# Patient Record
Sex: Male | Born: 1990 | Race: Black or African American | Hispanic: No | Marital: Single | Smoking: Current every day smoker
Health system: Southern US, Community
[De-identification: ages and names within clinical notes are randomized; demographics above are authoritative.]

## PROBLEM LIST (undated history)

## (undated) DIAGNOSIS — K529 Noninfective gastroenteritis and colitis, unspecified: Secondary | ICD-10-CM

## (undated) DIAGNOSIS — G8929 Other chronic pain: Secondary | ICD-10-CM

## (undated) DIAGNOSIS — R1115 Cyclical vomiting syndrome unrelated to migraine: Secondary | ICD-10-CM

## (undated) DIAGNOSIS — K219 Gastro-esophageal reflux disease without esophagitis: Secondary | ICD-10-CM

## (undated) DIAGNOSIS — R109 Unspecified abdominal pain: Secondary | ICD-10-CM

## (undated) HISTORY — PX: WISDOM TOOTH EXTRACTION: SHX21

## (undated) HISTORY — PX: COLONOSCOPY: SHX174

---
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
## 2012-03-05 ENCOUNTER — Emergency Department (HOSPITAL_COMMUNITY)
Admission: EM | Admit: 2012-03-05 | Discharge: 2012-03-05 | Disposition: A | Discharge status: Home / Self Care | Payer: 59 | Attending: Emergency Medicine | Admitting: Emergency Medicine

## 2012-03-05 ENCOUNTER — Encounter (HOSPITAL_COMMUNITY): Payer: Self-pay | Admitting: *Deleted

## 2012-03-05 DIAGNOSIS — F17200 Nicotine dependence, unspecified, uncomplicated: Secondary | ICD-10-CM | POA: Insufficient documentation

## 2012-03-05 DIAGNOSIS — Z202 Contact with and (suspected) exposure to infections with a predominantly sexual mode of transmission: Secondary | ICD-10-CM | POA: Insufficient documentation

## 2012-03-05 LAB — URINALYSIS, ROUTINE W REFLEX MICROSCOPIC
Bilirubin Urine: NEGATIVE
Nitrite: NEGATIVE
Specific Gravity, Urine: 1.025 (ref 1.005–1.030)
Urobilinogen, UA: 0.2 mg/dL (ref 0.0–1.0)
pH: 6.5 (ref 5.0–8.0)

## 2012-03-05 LAB — URINE MICROSCOPIC-ADD ON

## 2012-03-05 MED ORDER — METRONIDAZOLE 500 MG PO TABS
2000.0000 mg | ORAL_TABLET | Freq: Once | ORAL | Status: AC
  Administered 2012-03-05: 2000 mg via ORAL
  Filled 2012-03-05: qty 4

## 2012-03-05 NOTE — ED Provider Notes (Signed)
History     CSN: [id number]  Arrival date & time 03/05/12  1953   First MD Initiated Contact with Patient 03/05/12 1954      Chief Complaint  Patient presents with  . Exposure to STD    (Consider location/radiation/quality/duration/timing/severity/associated sxs/prior treatment) HPI Comments: Patient comes to ED requesting evaluation for possible exposure to an STD.  States that his recent sexual partner advised him that he may have trichomonas.  Patient denies any symptoms at this time.    Patient is a 21 y.o. male presenting with STD exposure. The history is provided by the patient.  Exposure to STD This is a new problem. The current episode started 1 to 4 weeks ago. Pertinent negatives include no abdominal pain, arthralgias, chills, fever, headaches, nausea, numbness, rash, sore throat, swollen glands, urinary symptoms, vomiting or weakness. Nothing aggravates the symptoms. He has tried nothing for the symptoms. The treatment provided no relief.    History reviewed. No pertinent past medical history.  History reviewed. No pertinent past surgical history.  History reviewed. No pertinent family history.  History  Substance Use Topics  . Smoking status: Current Everyday Smoker  . Smokeless tobacco: Not on file  . Alcohol Use: Yes      Review of Systems  Constitutional: Negative for fever, chills, activity change and appetite change.  HENT: Negative for sore throat and trouble swallowing.   Gastrointestinal: Negative for nausea, vomiting and abdominal pain.  Genitourinary: Negative for urgency, hematuria, decreased urine volume, discharge, penile swelling, difficulty urinating, genital sores and penile pain.  Musculoskeletal: Negative for back pain and arthralgias.  Skin: Negative for color change, rash and wound.  Neurological: Negative for dizziness, weakness, numbness and headaches.  All other systems reviewed and are negative.    Allergies  Pollen extract  Home  Medications  No current outpatient prescriptions on file.  BP 140/89  Pulse 78  Temp 98 F (36.7 C) (Oral)  Resp 18  Ht 6' (1.829 m)  Wt 125 lb (56.7 kg)  BMI 16.95 kg/m2  SpO2 100%  Physical Exam  Nursing note and vitals reviewed. Constitutional: He is oriented to person, place, and time. He appears well-developed and well-nourished. No distress.  HENT:  Head: Normocephalic and atraumatic.  Cardiovascular: Normal rate, regular rhythm, normal heart sounds and intact distal pulses.   No murmur heard. Pulmonary/Chest: Effort normal and breath sounds normal.  Abdominal: Soft. Bowel sounds are normal.  Genitourinary: Testes normal and penis normal. Circumcised. No penile erythema or penile tenderness. No discharge found.  Musculoskeletal: Normal range of motion. He exhibits no edema.  Neurological: He is alert and oriented to person, place, and time. He exhibits normal muscle tone. Coordination normal.  Skin: Skin is warm and dry.    ED Course  Procedures (including critical care time)  Results for orders placed during the hospital encounter of 03/05/12  URINALYSIS, ROUTINE W REFLEX MICROSCOPIC      Component Value Range   Color, Urine YELLOW  YELLOW   APPearance CLEAR  CLEAR   Specific Gravity, Urine 1.025  1.005 - 1.030   pH 6.5  5.0 - 8.0   Glucose, UA NEGATIVE  NEGATIVE mg/dL   Hgb urine dipstick NEGATIVE  NEGATIVE   Bilirubin Urine NEGATIVE  NEGATIVE   Ketones, ur NEGATIVE  NEGATIVE mg/dL   Protein, ur NEGATIVE  NEGATIVE mg/dL   Urobilinogen, UA 0.2  0.0 - 1.0 mg/dL   Nitrite NEGATIVE  NEGATIVE   Leukocytes, UA TRACE (*) NEGATIVE  URINE MICROSCOPIC-ADD ON      Component Value Range   Squamous Epithelial / LPF RARE  RARE   WBC, UA 3-6  <3 WBC/hpf   Bacteria, UA FEW (*) RARE   Urine-Other MUCOUS PRESENT           MDM     urine, gonorrhea and Chlamydia cultures are pending.    Patient is asymptomatic at this time , abdomen is soft and nontender. Vital  signs are stable. Possible exposure to Trichomonas, so I will treat with a one-time dose of metronidazole.  I have advised him that he will be notified if the cultures are positive.    Pt agrees to care plan and verbalized understanding.  The patient appears reasonably screened and/or stabilized for discharge and I doubt any other medical condition or other [facility] requiring further screening, evaluation, or treatment in the ED at this time prior to discharge.   Japleen Tornow L. [place], [place] 03/10/12 2159

## 2012-03-05 NOTE — ED Notes (Signed)
States that his girlfriend tested positive for trichomonas and he wants to be checked out.

## 2012-03-05 NOTE — ED Notes (Signed)
Pt says he has been exposed to std ?trichimonas.

## 2012-03-07 LAB — URINE CULTURE
Colony Count: NO GROWTH
Culture: NO GROWTH

## 2012-03-08 LAB — GC/CHLAMYDIA PROBE AMP, GENITAL
Chlamydia, DNA Probe: NEGATIVE
GC Probe Amp, Genital: NEGATIVE

## 2012-03-13 NOTE — ED Provider Notes (Signed)
Medical screening examination/treatment/procedure(s) were performed by non-physician practitioner and as supervising physician I was immediately available for consultation/collaboration.   Shelda Jakes, MD 03/13/12 1258

## 2014-03-31 ENCOUNTER — Emergency Department (HOSPITAL_COMMUNITY)
Admission: EM | Admit: 2014-03-31 | Discharge: 2014-03-31 | Disposition: A | Discharge status: Home / Self Care | Payer: 59 | Attending: Emergency Medicine | Admitting: Emergency Medicine

## 2014-03-31 ENCOUNTER — Encounter (HOSPITAL_COMMUNITY): Payer: Self-pay | Admitting: Emergency Medicine

## 2014-03-31 DIAGNOSIS — R361 Hematospermia: Secondary | ICD-10-CM | POA: Insufficient documentation

## 2014-03-31 DIAGNOSIS — F17200 Nicotine dependence, unspecified, uncomplicated: Secondary | ICD-10-CM | POA: Insufficient documentation

## 2014-03-31 DIAGNOSIS — R109 Unspecified abdominal pain: Secondary | ICD-10-CM | POA: Insufficient documentation

## 2014-03-31 DIAGNOSIS — R1084 Generalized abdominal pain: Secondary | ICD-10-CM | POA: Insufficient documentation

## 2014-03-31 DIAGNOSIS — G8929 Other chronic pain: Secondary | ICD-10-CM | POA: Insufficient documentation

## 2014-03-31 DIAGNOSIS — R748 Abnormal levels of other serum enzymes: Secondary | ICD-10-CM | POA: Insufficient documentation

## 2014-03-31 DIAGNOSIS — R112 Nausea with vomiting, unspecified: Secondary | ICD-10-CM | POA: Insufficient documentation

## 2014-03-31 DIAGNOSIS — R197 Diarrhea, unspecified: Secondary | ICD-10-CM | POA: Insufficient documentation

## 2014-03-31 LAB — CBC WITH DIFFERENTIAL/PLATELET
BASOS PCT: 0 % (ref 0–1)
Basophils Absolute: 0 10*3/uL (ref 0.0–0.1)
EOS ABS: 0.2 10*3/uL (ref 0.0–0.7)
Eosinophils Relative: 3 % (ref 0–5)
HEMATOCRIT: 40.8 % (ref 39.0–52.0)
HEMOGLOBIN: 14.4 g/dL (ref 13.0–17.0)
Lymphocytes Relative: 46 % (ref 12–46)
Lymphs Abs: 2.8 10*3/uL (ref 0.7–4.0)
MCH: 29 pg (ref 26.0–34.0)
MCHC: 35.3 g/dL (ref 30.0–36.0)
MCV: 82.1 fL (ref 78.0–100.0)
MONO ABS: 0.4 10*3/uL (ref 0.1–1.0)
MONOS PCT: 7 % (ref 3–12)
Neutro Abs: 2.6 10*3/uL (ref 1.7–7.7)
Neutrophils Relative %: 44 % (ref 43–77)
Platelets: 269 10*3/uL (ref 150–400)
RBC: 4.97 MIL/uL (ref 4.22–5.81)
RDW: 13.6 % (ref 11.5–15.5)
WBC: 6 10*3/uL (ref 4.0–10.5)

## 2014-03-31 LAB — COMPREHENSIVE METABOLIC PANEL
ALBUMIN: 4.2 g/dL (ref 3.5–5.2)
ALT: 9 U/L (ref 0–53)
ANION GAP: 11 (ref 5–15)
AST: 20 U/L (ref 0–37)
Alkaline Phosphatase: 76 U/L (ref 39–117)
BILIRUBIN TOTAL: 0.5 mg/dL (ref 0.3–1.2)
BUN: 11 mg/dL (ref 6–23)
CHLORIDE: 98 meq/L (ref 96–112)
CO2: 28 mEq/L (ref 19–32)
CREATININE: 1.09 mg/dL (ref 0.50–1.35)
Calcium: 9.3 mg/dL (ref 8.4–10.5)
GFR calc Af Amer: >90 mL/min (ref >90)
GFR calc non Af Amer: >90 mL/min (ref >90)
Glucose, Bld: 105 mg/dL — ABNORMAL HIGH (ref 70–99)
Potassium: 4.3 mEq/L (ref 3.7–5.3)
Sodium: 137 mEq/L (ref 137–147)
Total Protein: 7.5 g/dL (ref 6.0–8.3)

## 2014-03-31 LAB — URINALYSIS, ROUTINE W REFLEX MICROSCOPIC
BILIRUBIN URINE: NEGATIVE
Glucose, UA: NEGATIVE mg/dL
Hgb urine dipstick: NEGATIVE
KETONES UR: NEGATIVE mg/dL
Leukocytes, UA: NEGATIVE
NITRITE: NEGATIVE
PROTEIN: NEGATIVE mg/dL
UROBILINOGEN UA: 0.2 mg/dL (ref 0.0–1.0)
pH: 6 (ref 5.0–8.0)

## 2014-03-31 LAB — LIPASE, BLOOD: LIPASE: 82 U/L — AB (ref 11–59)

## 2014-03-31 MED ORDER — ONDANSETRON HCL 4 MG PO TABS: 4.0000 mg | ORAL_TABLET | Freq: Four times a day (QID) | ORAL | Status: DC | PRN

## 2014-03-31 NOTE — ED Provider Notes (Signed)
CSN: [id number]     Arrival date & time 03/31/14  0036 History   First MD Initiated Contact with Patient 03/31/14 0102     Chief Complaint  Patient presents with  . Abdominal Pain     (Consider location/radiation/quality/duration/timing/severity/associated sxs/prior Treatment) Patient is a 23 y.o. male presenting with abdominal pain. The history is provided by the patient.  Abdominal Pain He complains of crampy, generalized abdominal pain for the past year. Symptoms to be worse in the morning when he wakes up and then will subside after a few hours. Pain sometimes wakes him up in the early morning hours. He rates pain at 8/10 to when it is flaring up in the morning and then it subsides over the course of the day. He will frequently will have vomiting or diarrhea shortly after getting up the morning. He feels a little bit better if he vomits but has not noticed any change with diarrhea. He is not noticed any change in his appetite. Pain is not affected by eating. He denies fever, chills, sweats. He does not think he has lost any weight but his mother thinks he may have lost some weight. He has not done any treatment for this nor has he sought medical attention before tonight. A second complaint is that he noticed blood from his penis following sexual intercourse 2 days ago.  History reviewed. No pertinent past medical history. History reviewed. No pertinent past surgical history. History reviewed. No pertinent family history. History  Substance Use Topics  . Smoking status: Current Every Day Smoker  . Smokeless tobacco: Not on file  . Alcohol Use: Yes    Review of Systems  Gastrointestinal: Positive for abdominal pain.  All other systems reviewed and are negative.     Allergies  Pollen extract  Home Medications   Prior to Admission medications   Not on File   BP 117/81  Pulse 72  Temp(Src) 98.2 F (36.8 C) (Oral)  Resp 18  Ht 6' (1.829 m)  Wt 135 lb (61.236 kg)  BMI 18.31  kg/m2  SpO2 97% Physical Exam  Nursing note and vitals reviewed.  23 year old male, resting comfortably and in no acute distress. Vital signs are normal. Oxygen saturation is 97%, which is normal. Head is normocephalic and atraumatic. PERRLA, EOMI. Oropharynx is clear. Neck is nontender and supple without adenopathy or JVD. Back is nontender and there is no CVA tenderness. Lungs are clear without rales, wheezes, or rhonchi. Chest is nontender. Heart has regular rate and rhythm without murmur. Abdomen is soft, flat, nontender without masses or hepatosplenomegaly and peristalsis is normoactive. Today: Circumcised penis. Shotty bilateral inguinal adenopathy. Testes descended without masses. No other abnormality seen. Extremities have no cyanosis or edema, full range of motion is present. Skin is warm and dry without rash. Neurologic: Mental status is normal, cranial nerves are intact, there are no motor or sensory deficits.  ED Course  Procedures (including critical care time) Labs Review Results for orders placed during the hospital encounter of 03/31/14  CBC WITH DIFFERENTIAL      Result Value Ref Range   WBC 6.0  4.0 - 10.5 K/uL   RBC 4.97  4.22 - 5.81 MIL/uL   Hemoglobin 14.4  13.0 - 17.0 g/dL   HCT 40.9  81.1 - 91.4 %   MCV 82.1  78.0 - 100.0 fL   MCH 29.0  26.0 - 34.0 pg   MCHC 35.3  30.0 - 36.0 g/dL   RDW 13.6  11.5 - 15.5 %   Platelets 269  150 - 400 K/uL   Neutrophils Relative % 44  43 - 77 %   Neutro Abs 2.6  1.7 - 7.7 K/uL   Lymphocytes Relative 46  12 - 46 %   Lymphs Abs 2.8  0.7 - 4.0 K/uL   Monocytes Relative 7  3 - 12 %   Monocytes Absolute 0.4  0.1 - 1.0 K/uL   Eosinophils Relative 3  0 - 5 %   Eosinophils Absolute 0.2  0.0 - 0.7 K/uL   Basophils Relative 0  0 - 1 %   Basophils Absolute 0.0  0.0 - 0.1 K/uL  COMPREHENSIVE METABOLIC PANEL      Result Value Ref Range   Sodium 137  137 - 147 mEq/L   Potassium 4.3  3.7 - 5.3 mEq/L   Chloride 98  96 - 112 mEq/L    CO2 28  19 - 32 mEq/L   Glucose, Bld 105 (*) 70 - 99 mg/dL   BUN 11  6 - 23 mg/dL   Creatinine, Ser 1.611.09  0.50 - 1.35 mg/dL   Calcium 9.3  8.4 - 09.610.5 mg/dL   Total Protein 7.5  6.0 - 8.3 g/dL   Albumin 4.2  3.5 - 5.2 g/dL   AST 20  0 - 37 U/L   ALT 9  0 - 53 U/L   Alkaline Phosphatase 76  39 - 117 U/L   Total Bilirubin 0.5  0.3 - 1.2 mg/dL   GFR calc non Af Amer >90  >90 mL/min   GFR calc Af Amer >90  >90 mL/min   Anion gap 11  5 - 15  LIPASE, BLOOD      Result Value Ref Range   Lipase 82 (*) 11 - 59 U/L  URINALYSIS, ROUTINE W REFLEX MICROSCOPIC      Result Value Ref Range   Color, Urine YELLOW  YELLOW   APPearance CLEAR  CLEAR   Specific Gravity, Urine <1.005 (*) 1.005 - 1.030   pH 6.0  5.0 - 8.0   Glucose, UA NEGATIVE  NEGATIVE mg/dL   Hgb urine dipstick NEGATIVE  NEGATIVE   Bilirubin Urine NEGATIVE  NEGATIVE   Ketones, ur NEGATIVE  NEGATIVE mg/dL   Protein, ur NEGATIVE  NEGATIVE mg/dL   Urobilinogen, UA 0.2  0.0 - 1.0 mg/dL   Nitrite NEGATIVE  NEGATIVE   Leukocytes, UA NEGATIVE  NEGATIVE    MDM   Final diagnoses:  Abdominal pain of unknown etiology  Hemospermia  Nausea vomiting and diarrhea  Elevated lipase    Chronic abdominal pain of uncertain cause. This may be just represent irritable bowel syndrome, consider possibility of Crohn's disease. Workup is beyond the capabilities to the ED but screening labs will be obtained. Hemospermia of uncertain cause. He will be referred to urology for followup.  Initial workup is unremarkable. I suspect that his abdominal pain will eventually proved to be irritable bowel syndrome. He is referred to GI and urology for outpatient workup.  Dione Boozeavid Lizandra Zakrzewski, MD 03/31/14 0600

## 2014-03-31 NOTE — Discharge Instructions (Signed)
Your evaluation showed a mildly elevated lipase which I do not think is actually related to your pain. All of your other tests were normal. You should follow up with the gastroenterologist for further evaluation. You also should follow up with the urologist. Return if your symptoms are getting worse.  Ondansetron tablets What is this medicine? ONDANSETRON (on DAN se tron) is used to treat nausea and vomiting caused by chemotherapy. It is also used to prevent or treat nausea and vomiting after surgery. This medicine may be used for other purposes; ask your health care provider or pharmacist if you have questions. COMMON BRAND NAME(S): Zofran What should I tell my health care provider before I take this medicine? They need to know if you have any of these conditions: -heart disease -history of irregular heartbeat -liver disease -low levels of magnesium or potassium in the blood -an unusual or allergic reaction to ondansetron, granisetron, other medicines, foods, dyes, or preservatives -pregnant or trying to get pregnant -breast-feeding How should I use this medicine? Take this medicine by mouth with a glass of water. Follow the directions on your prescription label. Take your doses at regular intervals. Do not take your medicine more often than directed. Talk to your pediatrician regarding the use of this medicine in children. Special care may be needed. Overdosage: If you think you have taken too much of this medicine contact a poison control center or emergency room at once. NOTE: This medicine is only for you. Do not share this medicine with others. What if I miss a dose? If you miss a dose, take it as soon as you can. If it is almost time for your next dose, take only that dose. Do not take double or extra doses. What may interact with this medicine? Do not take this medicine with any of the following medications: -apomorphine -certain medicines for fungal infections like fluconazole,  itraconazole, ketoconazole, posaconazole, voriconazole -cisapride -dofetilide -dronedarone -pimozide -thioridazine -ziprasidone This medicine may also interact with the following medications: -carbamazepine -certain medicines for depression, anxiety, or psychotic disturbances -fentanyl -linezolid -MAOIs like Carbex, Eldepryl, Marplan, Nardil, and Parnate -methylene blue (injected into a vein) -other medicines that prolong the QT interval (cause an abnormal heart rhythm) -phenytoin -rifampicin -tramadol This list may not describe all possible interactions. Give your health care provider a list of all the medicines, herbs, non-prescription drugs, or dietary supplements you use. Also tell them if you smoke, drink alcohol, or use illegal drugs. Some items may interact with your medicine. What should I watch for while using this medicine? Check with your doctor or health care professional right away if you have any sign of an allergic reaction. What side effects may I notice from receiving this medicine? Side effects that you should report to your doctor or health care professional as soon as possible: -allergic reactions like skin rash, itching or hives, swelling of the face, lips or tongue -breathing problems -confusion -dizziness -fast or irregular heartbeat -feeling faint or lightheaded, falls -fever and chills -loss of balance or coordination -seizures -sweating -swelling of the hands or feet -tightness in the chest -tremors -unusually weak or tired Side effects that usually do not require medical attention (report to your doctor or health care professional if they continue or are bothersome): -constipation or diarrhea -headache This list may not describe all possible side effects. Call your doctor for medical advice about side effects. You may report side effects to FDA at 1-800-FDA-1088. Where should I keep my medicine? Keep out  of the reach of children. Store between 2  and 30 degrees C (36 and 86 degrees F). Throw away any unused medicine after the expiration date. NOTE: This sheet is a summary. It may not cover all possible information. If you have questions about this medicine, talk to your doctor, pharmacist, or health care provider.  2015, [employer]vier/Gold Standard. (2013-05-27 16:27:45)

## 2014-03-31 NOTE — ED Notes (Signed)
Patient reports diffuse abdominal pain. Reports has been having issues for approximately a year. Also reports nausea and vomiting. States blood came from tip of penis on Sunday after recently having intercourse.

## 2014-04-09 ENCOUNTER — Encounter (INDEPENDENT_AMBULATORY_CARE_PROVIDER_SITE_OTHER): Payer: Self-pay | Admitting: *Deleted

## 2014-04-09 ENCOUNTER — Encounter (INDEPENDENT_AMBULATORY_CARE_PROVIDER_SITE_OTHER): Payer: Self-pay | Admitting: Internal Medicine

## 2014-04-09 ENCOUNTER — Ambulatory Visit (INDEPENDENT_AMBULATORY_CARE_PROVIDER_SITE_OTHER): Payer: 59 | Admitting: Internal Medicine

## 2014-04-09 ENCOUNTER — Other Ambulatory Visit (INDEPENDENT_AMBULATORY_CARE_PROVIDER_SITE_OTHER): Payer: Self-pay | Admitting: *Deleted

## 2014-04-09 VITALS — BP 92/70 | HR 76 | Temp 97.9°F | Ht 72.0 in | Wt 124.9 lb

## 2014-04-09 DIAGNOSIS — G8929 Other chronic pain: Secondary | ICD-10-CM

## 2014-04-09 DIAGNOSIS — R112 Nausea with vomiting, unspecified: Secondary | ICD-10-CM | POA: Insufficient documentation

## 2014-04-09 DIAGNOSIS — R1013 Epigastric pain: Secondary | ICD-10-CM

## 2014-04-09 DIAGNOSIS — R1115 Cyclical vomiting syndrome unrelated to migraine: Secondary | ICD-10-CM

## 2014-04-09 MED ORDER — OMEPRAZOLE 40 MG PO CPDR: 40.0000 mg | DELAYED_RELEASE_CAPSULE | Freq: Every day | ORAL | Status: DC

## 2014-04-09 NOTE — Progress Notes (Signed)
Subjective:     Patient ID: Aaron Roman, male   DOB: 05/22/1991, 23 y.o.   MRN: [id number]  HPI Referred to our office from the ED for epigastric pain. Symptoms for a year. Nausea and vomiting associated with his symptoms.  N and V every morning.  Feels like food is sitting in his epigastric region. He says he has lost about 10 pounds over the past 3 months.  His BMs are green, or brown. No melena or BRRB.  He tells me he has the pain in the morning usually.  His appetite is not good. When he eats, he will have nausea. No particular foods.  No NSAID's.  While at office today, mother had to get him out of BR, He was vomiting. No fever associated with his symptoms.    CMP     Component Value Date/Time   NA 137 03/31/2014 0123   K 4.3 03/31/2014 0123   CL 98 03/31/2014 0123   CO2 28 03/31/2014 0123   GLUCOSE 105* 03/31/2014 0123   BUN 11 03/31/2014 0123   CREATININE 1.09 03/31/2014 0123   CALCIUM 9.3 03/31/2014 0123   PROT 7.5 03/31/2014 0123   ALBUMIN 4.2 03/31/2014 0123   AST 20 03/31/2014 0123   ALT 9 03/31/2014 0123   ALKPHOS 76 03/31/2014 0123   BILITOT 0.5 03/31/2014 0123   GFRNONAA >90 03/31/2014 0123   GFRAA >90 03/31/2014 0123      CBC    Component Value Date/Time   WBC 6.0 03/31/2014 0123   RBC 4.97 03/31/2014 0123   HGB 14.4 03/31/2014 0123   HCT 40.8 03/31/2014 0123   PLT 269 03/31/2014 0123   MCV 82.1 03/31/2014 0123   MCH 29.0 03/31/2014 0123   MCHC 35.3 03/31/2014 0123   RDW 13.6 03/31/2014 0123   LYMPHSABS 2.8 03/31/2014 0123   MONOABS 0.4 03/31/2014 0123   EOSABS 0.2 03/31/2014 0123   BASOSABS 0.0 03/31/2014 0123   Lipase     Component Value Date/Time   LIPASE 82* 03/31/2014 0123     Review of Systems History reviewed. No pertinent past medical history.  Past Surgical History  Procedure Laterality Date  . Wisdom tooth extraction      Allergies  Allergen Reactions  . Pollen Extract Itching    Current Outpatient Prescriptions on File Prior to Visit  Medication  Sig Dispense Refill  . ondansetron (ZOFRAN) 4 MG tablet Take 1 tablet (4 mg total) by mouth every 6 (six) hours as needed.  20 tablet  0   No current facility-administered medications on file prior to visit.        Objective:   Physical Exam  Filed Vitals:   04/09/14 0915  BP: 92/70  Pulse: 76  Temp: 97.9 F (36.6 C)  Height: 6' (1.829 m)  Weight: 124 lb 14.4 oz (56.654 kg)   Alert and oriented. Appears uncomfortable.  Skin warm and dry. Oral mucosa is moist.   . Sclera anicteric, conjunctivae is pink. Thyroid not enlarged. No cervical lymphadenopathy. Lungs clear. Heart regular rate and rhythm.  Abdomen is soft. Bowel sounds are positive. No hepatomegaly. No abdominal masses felt. No tenderness on exam.  No edema to lower extremities.       Assessment:     Epigastric pain with eating. Nausea and vomiting. PUD needs to be ruled out. GB disease in the differential     Plan:    Lipase. US abdomen. EGD. The risks and benefits such as perforation, bleeding, and  infection were reviewed with the patient and is agreeable.   Omeprazole 40 mg eprescribed to his pharmacy.

## 2014-04-09 NOTE — Patient Instructions (Signed)
EGD. US abdomen.  Lipase.

## 2014-04-10 LAB — LIPASE: Lipase: 27 U/L (ref 0–75)

## 2014-04-14 ENCOUNTER — Ambulatory Visit (HOSPITAL_COMMUNITY)
Admission: RE | Admit: 2014-04-14 | Discharge: 2014-04-14 | Disposition: A | Discharge status: Home / Self Care | Payer: 59 | Source: Ambulatory Visit | Attending: Internal Medicine | Admitting: Internal Medicine

## 2014-04-14 ENCOUNTER — Encounter (HOSPITAL_COMMUNITY): Payer: Self-pay | Admitting: *Deleted

## 2014-04-14 ENCOUNTER — Encounter (HOSPITAL_COMMUNITY)
Admission: RE | Disposition: A | Discharge status: Home / Self Care | Payer: Self-pay | Source: Ambulatory Visit | Attending: Internal Medicine

## 2014-04-14 ENCOUNTER — Other Ambulatory Visit (INDEPENDENT_AMBULATORY_CARE_PROVIDER_SITE_OTHER): Payer: Self-pay | Admitting: Internal Medicine

## 2014-04-14 DIAGNOSIS — R634 Abnormal weight loss: Secondary | ICD-10-CM | POA: Diagnosis not present

## 2014-04-14 DIAGNOSIS — F17200 Nicotine dependence, unspecified, uncomplicated: Secondary | ICD-10-CM | POA: Insufficient documentation

## 2014-04-14 DIAGNOSIS — R197 Diarrhea, unspecified: Secondary | ICD-10-CM | POA: Diagnosis not present

## 2014-04-14 DIAGNOSIS — R112 Nausea with vomiting, unspecified: Secondary | ICD-10-CM | POA: Insufficient documentation

## 2014-04-14 DIAGNOSIS — K219 Gastro-esophageal reflux disease without esophagitis: Secondary | ICD-10-CM | POA: Diagnosis not present

## 2014-04-14 DIAGNOSIS — K2960 Other gastritis without bleeding: Secondary | ICD-10-CM | POA: Diagnosis not present

## 2014-04-14 DIAGNOSIS — Z79899 Other long term (current) drug therapy: Secondary | ICD-10-CM | POA: Diagnosis not present

## 2014-04-14 DIAGNOSIS — R1013 Epigastric pain: Secondary | ICD-10-CM | POA: Insufficient documentation

## 2014-04-14 DIAGNOSIS — G8929 Other chronic pain: Secondary | ICD-10-CM

## 2014-04-14 HISTORY — PX: ESOPHAGOGASTRODUODENOSCOPY: SHX5428

## 2014-04-14 HISTORY — DX: Gastro-esophageal reflux disease without esophagitis: K21.9

## 2014-04-14 SURGERY — EGD (ESOPHAGOGASTRODUODENOSCOPY)
Anesthesia: Moderate Sedation

## 2014-04-14 MED ORDER — MIDAZOLAM HCL 5 MG/5ML IJ SOLN
INTRAMUSCULAR | Status: DC | PRN
Start: 2014-04-14 — End: 2014-04-14
  Administered 2014-04-14 (×2): 3 mg via INTRAVENOUS
  Administered 2014-04-14 (×2): 2 mg via INTRAVENOUS

## 2014-04-14 MED ORDER — MEPERIDINE HCL 50 MG/ML IJ SOLN
INTRAMUSCULAR | Status: AC
  Filled 2014-04-14: qty 1

## 2014-04-14 MED ORDER — SODIUM CHLORIDE 0.9 % IV SOLN
INTRAVENOUS | Status: DC
  Administered 2014-04-14: 08:00:00 via INTRAVENOUS

## 2014-04-14 MED ORDER — BUTAMBEN-TETRACAINE-BENZOCAINE 2-2-14 % EX AERO
INHALATION_SPRAY | CUTANEOUS | Status: DC | PRN
  Administered 2014-04-14: 2 via TOPICAL

## 2014-04-14 MED ORDER — MEPERIDINE HCL 50 MG/ML IJ SOLN
INTRAMUSCULAR | Status: DC | PRN
  Administered 2014-04-14 (×2): 25 mg via INTRAVENOUS

## 2014-04-14 MED ORDER — DICYCLOMINE HCL 10 MG PO CAPS: 10.0000 mg | ORAL_CAPSULE | Freq: Three times a day (TID) | ORAL | Status: DC

## 2014-04-14 MED ORDER — SIMETHICONE 40 MG/0.6ML PO SUSP
ORAL | Status: DC | PRN
Start: 2014-04-14 — End: 2014-04-14
  Administered 2014-04-14: 08:00:00

## 2014-04-14 MED ORDER — MIDAZOLAM HCL 5 MG/5ML IJ SOLN
INTRAMUSCULAR | Status: AC
  Filled 2014-04-14: qty 10

## 2014-04-14 NOTE — H&P (Signed)
Aaron Roman is an 23 y.o. male.   Chief Complaint: Patient is here for EGD. HPI: Patient is a [age] year old African male who presents with one-day history of intermittent epigastric pain postprandial nausea and vomiting. GU symptoms been occurring daily. Prior workup includes normal LFTs and serum calcium. He had mildly elevated lipase on one occasion but repeat was within normal limits. He has been on PPI for 6 days but not any better. He denies heartburn hematemesis melena or rectal bleeding. He also complains of diarrhea but 56 stools per day. He denies fever chills or night sweats. He smokes about half a pack of cigarettes per day. He drinks alcohol socially once or twice a week no more than 2 cans at a time. Family history is negative for inflammatory bowel disease or cholelithiasis. He works as a Production designer, theatre/television/filmmanager at [employer] [place]  Past Medical History  Diagnosis Date  . GERD (gastroesophageal reflux disease)     Past Surgical History  Procedure Laterality Date  . Wisdom tooth extraction      History reviewed. No pertinent family history. Social History:  reports that he has been smoking Cigarettes.  He has a 5 pack-year smoking history. He does not have any smokeless tobacco history on file. He reports that he drinks alcohol. He reports that he does not use illicit drugs.  Allergies:  Allergies  Allergen Reactions  . Codeine Nausea And Vomiting  . Pollen Extract Itching    Medications Prior to Admission  Medication Sig Dispense Refill  . omeprazole (PRILOSEC) 40 MG capsule Take 1 capsule (40 mg total) by mouth daily.  90 capsule  3  . ondansetron (ZOFRAN) 4 MG tablet Take 1 tablet (4 mg total) by mouth every 6 (six) hours as needed.  20 tablet  0    No results found for this or any previous visit (from the past 48 hour(s)). No results found.  ROS  Blood pressure 123/87, pulse 69, temperature 98 F (36.7 C), temperature source Oral, resp. rate 18, SpO2  100.00%. Physical Exam  Constitutional: He is oriented to person, place, and time.  Well-developed thin African American male in NAD  HENT:  Mouth/Throat: Oropharynx is clear and moist.  Eyes: Conjunctivae are normal. No scleral icterus.  Neck: No thyromegaly present.  Cardiovascular: Normal rate, regular rhythm and normal heart sounds.   No murmur heard. Respiratory: Effort normal and breath sounds normal.  GI: Soft. He exhibits no distension and no mass. There is no tenderness.  Musculoskeletal: He exhibits no edema.  Lymphadenopathy:    He has no cervical adenopathy.  Neurological: He is alert and oriented to person, place, and time.  Skin:  He has multiple tattoos located at both forearms and at abdomen     Assessment/Plan Epigastric pain, nausea and vomiting. Diagnostic EGD.  Benicia Bergevin U 04/14/2014, 8:34 AM

## 2014-04-14 NOTE — Discharge Instructions (Signed)
Resume usual medications and diet. Dicyclomine 10 mg by mouth 15-30 minutes daily before each meal. No driving for 24 hours. Ultrasound of upper abdomen as planned Small bowel follow-through to be scheduled.  Colonoscopy, Care After Refer to this sheet in the next few weeks. These instructions provide you with information on caring for yourself after your procedure. Your health care provider may also give you more specific instructions. Your treatment has been planned according to current medical practices, but problems sometimes occur. Call your health care provider if you have any problems or questions after your procedure. WHAT TO EXPECT AFTER THE PROCEDURE  After your procedure, it is typical to have the following:  A small amount of blood in your stool.  Moderate amounts of gas and mild abdominal cramping or bloating. HOME CARE INSTRUCTIONS  Do not drive, operate machinery, or sign important documents for 24 hours.  You may shower and resume your regular physical activities, but move at a slower pace for the first 24 hours.  Take frequent rest periods for the first 24 hours.  Walk around or put a warm pack on your abdomen to help reduce abdominal cramping and bloating.  Drink enough fluids to keep your urine clear or pale yellow.  You may resume your normal diet as instructed by your health care provider. Avoid heavy or fried foods that are hard to digest.  Avoid drinking alcohol for 24 hours or as instructed by your health care provider.  Only take over-the-counter or prescription medicines as directed by your health care provider.  If a tissue sample (biopsy) was taken during your procedure:  Do not take aspirin or blood thinners for 7 days, or as instructed by your health care provider.  Do not drink alcohol for 7 days, or as instructed by your health care provider.  Eat soft foods for the first 24 hours. SEEK MEDICAL CARE IF: You have persistent spotting of blood in  your stool 2-3 days after the procedure. SEEK IMMEDIATE MEDICAL CARE IF:  You have more than a small spotting of blood in your stool.  You pass large blood clots in your stool.  Your abdomen is swollen (distended).  You have nausea or vomiting.  You have a fever.  You have increasing abdominal pain that is not relieved with medicine.

## 2014-04-14 NOTE — Op Note (Signed)
EGD PROCEDURE REPORT  PATIENT:  Aaron Roman  MR#:  [id number] Birthdate:  01/22/1991, 23 y.o., male Endoscopist:  Dr. Malissa HippoNajeeb U. Rehman, MD  Procedure Date: 04/14/2014  Procedure:   EGD  Indications:  The patient is a 23 year old African male who presents with a one-year history of intermittent epigastric pain nausea and vomiting. Symptoms endocrine on daily basis lately. He has lost about 10 pounds. He also complains of nonbloody diarrhea. He was seen in emergency room about 2 weeks ago and serum lipase was mildly elevated. Rest of his blood work including CBC and comprehensive chemistry panel was normal. We repeated his office last week and it is normal. He is undergoing diagnostic EGD. He has been on PPI for few days and not any better.            Informed Consent:  The risks, benefits, alternatives & imponderables which include, but are not limited to, bleeding, infection, perforation, drug reaction and potential missed lesion have been reviewed.  The potential for biopsy, lesion removal, esophageal dilation, etc. have also been discussed.  Questions have been answered.  All parties agreeable.  Please see history & physical in medical record for more information.  Medications:  Demerol 50 mg IV Versed 10 mg IV Cetacaine spray topically for oropharyngeal anesthesia  Description of procedure:  The endoscope was introduced through the mouth and advanced to the second portion of the duodenum without difficulty or limitations. The mucosal surfaces were surveyed very carefully during advancement of the scope and upon withdrawal.  Findings:  Esophagus:  Mucosa of the esophagus was normal. GE junction was unremarkable. GEJ:  43 cm Stomach:  Stomach was empty and distended very well with insufflation. Folds in the proximal stomach were normal. Examination of mucosa at gastric body was normal. Two prepyloric erosions are noted. Bilateral external was patent. Angularis fundus and cardia are  normal. Duodenum:  There was a small piece of food debris in the bulb. Bulbar mucosa was normal. Post bulbar mucosa was also normal.  Therapeutic/Diagnostic Maneuvers Performed:  None  Complications:  None  Impression: Erosive gastritis otherwise normal EGD.  Comment; This finding would not explain patient's symptom complex.  Recommendations:  H. pylori serology. Continue omeprazole for now. Dicyclomine 10 mg by mouth 3 times a day before each meal. Ultrasound of upper abdomen and small bowel follow-through.  REHMAN,NAJEEB U  04/14/2014  8:57 AM  CC: Dr. Bonnetta BarryNo PCP Per Patient & Dr. No ref. provider found

## 2014-04-15 LAB — H. PYLORI ANTIBODY, IGG: H Pylori IgG: <0.4 {ISR}

## 2014-04-16 ENCOUNTER — Ambulatory Visit (HOSPITAL_COMMUNITY)
Admission: RE | Admit: 2014-04-16 | Discharge: 2014-04-16 | Disposition: A | Discharge status: Home / Self Care | Payer: 59 | Source: Ambulatory Visit | Attending: Internal Medicine | Admitting: Internal Medicine

## 2014-04-16 DIAGNOSIS — R1013 Epigastric pain: Secondary | ICD-10-CM | POA: Insufficient documentation

## 2014-04-16 DIAGNOSIS — G8929 Other chronic pain: Secondary | ICD-10-CM | POA: Insufficient documentation

## 2014-04-16 DIAGNOSIS — R1115 Cyclical vomiting syndrome unrelated to migraine: Secondary | ICD-10-CM | POA: Insufficient documentation

## 2014-04-16 DIAGNOSIS — R197 Diarrhea, unspecified: Secondary | ICD-10-CM | POA: Insufficient documentation

## 2014-04-16 DIAGNOSIS — R161 Splenomegaly, not elsewhere classified: Secondary | ICD-10-CM | POA: Insufficient documentation

## 2014-04-16 DIAGNOSIS — R112 Nausea with vomiting, unspecified: Secondary | ICD-10-CM

## 2014-04-16 DIAGNOSIS — R932 Abnormal findings on diagnostic imaging of liver and biliary tract: Secondary | ICD-10-CM | POA: Insufficient documentation

## 2014-04-16 DIAGNOSIS — R933 Abnormal findings on diagnostic imaging of other parts of digestive tract: Secondary | ICD-10-CM | POA: Insufficient documentation

## 2014-04-18 ENCOUNTER — Encounter (HOSPITAL_COMMUNITY): Payer: Self-pay | Admitting: Emergency Medicine

## 2014-04-18 ENCOUNTER — Emergency Department (HOSPITAL_COMMUNITY): Payer: 59

## 2014-04-18 ENCOUNTER — Emergency Department (HOSPITAL_COMMUNITY)
Admission: EM | Admit: 2014-04-18 | Discharge: 2014-04-18 | Disposition: A | Discharge status: Home / Self Care | Payer: 59 | Attending: Emergency Medicine | Admitting: Emergency Medicine

## 2014-04-18 DIAGNOSIS — G8929 Other chronic pain: Secondary | ICD-10-CM | POA: Diagnosis not present

## 2014-04-18 DIAGNOSIS — Z79899 Other long term (current) drug therapy: Secondary | ICD-10-CM | POA: Insufficient documentation

## 2014-04-18 DIAGNOSIS — F17200 Nicotine dependence, unspecified, uncomplicated: Secondary | ICD-10-CM | POA: Diagnosis not present

## 2014-04-18 DIAGNOSIS — R112 Nausea with vomiting, unspecified: Secondary | ICD-10-CM | POA: Diagnosis not present

## 2014-04-18 DIAGNOSIS — R1012 Left upper quadrant pain: Secondary | ICD-10-CM | POA: Diagnosis not present

## 2014-04-18 DIAGNOSIS — K219 Gastro-esophageal reflux disease without esophagitis: Secondary | ICD-10-CM | POA: Diagnosis not present

## 2014-04-18 HISTORY — DX: Unspecified abdominal pain: R10.9

## 2014-04-18 HISTORY — DX: Cyclical vomiting syndrome unrelated to migraine: R11.15

## 2014-04-18 HISTORY — DX: Noninfective gastroenteritis and colitis, unspecified: K52.9

## 2014-04-18 HISTORY — DX: Other chronic pain: G89.29

## 2014-04-18 LAB — COMPREHENSIVE METABOLIC PANEL
ALBUMIN: 4.3 g/dL (ref 3.5–5.2)
ALK PHOS: 71 U/L (ref 39–117)
ALT: 8 U/L (ref 0–53)
AST: 19 U/L (ref 0–37)
Anion gap: 11 (ref 5–15)
BUN: 10 mg/dL (ref 6–23)
CHLORIDE: 99 meq/L (ref 96–112)
CO2: 29 mEq/L (ref 19–32)
CREATININE: 0.9 mg/dL (ref 0.50–1.35)
Calcium: 9.4 mg/dL (ref 8.4–10.5)
GFR calc Af Amer: >90 mL/min (ref >90)
GFR calc non Af Amer: >90 mL/min (ref >90)
Glucose, Bld: 99 mg/dL (ref 70–99)
POTASSIUM: 4.1 meq/L (ref 3.7–5.3)
Sodium: 139 mEq/L (ref 137–147)
Total Bilirubin: 0.6 mg/dL (ref 0.3–1.2)
Total Protein: 7.4 g/dL (ref 6.0–8.3)

## 2014-04-18 LAB — CBC WITH DIFFERENTIAL/PLATELET
BASOS ABS: 0 10*3/uL (ref 0.0–0.1)
BASOS PCT: 0 % (ref 0–1)
Eosinophils Absolute: 0 10*3/uL (ref 0.0–0.7)
Eosinophils Relative: 1 % (ref 0–5)
HCT: 39.7 % (ref 39.0–52.0)
Hemoglobin: 13.7 g/dL (ref 13.0–17.0)
Lymphocytes Relative: 41 % (ref 12–46)
Lymphs Abs: 1.7 10*3/uL (ref 0.7–4.0)
MCH: 28.7 pg (ref 26.0–34.0)
MCHC: 34.5 g/dL (ref 30.0–36.0)
MCV: 83.1 fL (ref 78.0–100.0)
Monocytes Absolute: 0.3 10*3/uL (ref 0.1–1.0)
Monocytes Relative: 8 % (ref 3–12)
NEUTROS PCT: 50 % (ref 43–77)
Neutro Abs: 2.1 10*3/uL (ref 1.7–7.7)
PLATELETS: 270 10*3/uL (ref 150–400)
RBC: 4.78 MIL/uL (ref 4.22–5.81)
RDW: 13.4 % (ref 11.5–15.5)
WBC: 4.2 10*3/uL (ref 4.0–10.5)

## 2014-04-18 LAB — LIPASE, BLOOD: LIPASE: 65 U/L — AB (ref 11–59)

## 2014-04-18 MED ORDER — SODIUM CHLORIDE 0.9 % IV SOLN
INTRAVENOUS | Status: DC
  Administered 2014-04-18: 23:00:00 via INTRAVENOUS

## 2014-04-18 MED ORDER — PROMETHAZINE HCL 25 MG/ML IJ SOLN
12.5000 mg | Freq: Once | INTRAMUSCULAR | Status: AC
  Administered 2014-04-18: 12.5 mg via INTRAVENOUS
  Filled 2014-04-18: qty 1

## 2014-04-18 MED ORDER — FAMOTIDINE IN NACL 20-0.9 MG/50ML-% IV SOLN
20.0000 mg | Freq: Once | INTRAVENOUS | Status: AC
  Administered 2014-04-18: 20 mg via INTRAVENOUS
  Filled 2014-04-18: qty 50

## 2014-04-18 MED ORDER — PROMETHAZINE HCL 25 MG PO TABS: 25.0000 mg | ORAL_TABLET | Freq: Four times a day (QID) | ORAL | Status: DC | PRN

## 2014-04-18 MED ORDER — SODIUM CHLORIDE 0.9 % IV BOLUS (SEPSIS)
500.0000 mL | Freq: Once | INTRAVENOUS | Status: AC
  Administered 2014-04-18: 500 mL via INTRAVENOUS

## 2014-04-18 MED ORDER — DICYCLOMINE HCL 10 MG/ML IM SOLN
20.0000 mg | Freq: Once | INTRAMUSCULAR | Status: AC
  Administered 2014-04-18: 20 mg via INTRAMUSCULAR
  Filled 2014-04-18: qty 2

## 2014-04-18 MED ORDER — PROMETHAZINE HCL 25 MG RE SUPP: 25.0000 mg | Freq: Four times a day (QID) | RECTAL | Status: DC | PRN

## 2014-04-18 NOTE — ED Provider Notes (Signed)
CSN: [id number]     Arrival date & time 04/18/14  2054 History   First MD Initiated Contact with Patient 04/18/14 2110     Chief Complaint  Patient presents with  . Abdominal Pain     HPI Pt was seen at 2120. Per pt, c/o gradual onset and persistence of constant acute flair of his chronic of N/V/D for the past 1+ year. Pt describes his symptoms as occurring daily, "when I wake up," then subsiding over the course of the day. Has been associated with "cramping" and "sharp" LUQ abd pain. Denies any change in his symptoms over the past year and despite medications prescribed by his GI MD. Denies CP/SOB, no back pain, no fevers, no black or blood in stools or emesis.    GI: Rehman Past Medical History  Diagnosis Date  . GERD (gastroesophageal reflux disease)   . Chronic abdominal pain   . Cyclical vomiting with nausea   . Chronic diarrhea    Past Surgical History  Procedure Laterality Date  . Wisdom tooth extraction      History  Substance Use Topics  . Smoking status: Current Every Day Smoker -- 0.50 packs/day for 10 years    Types: Cigarettes  . Smokeless tobacco: Not on file  . Alcohol Use: Yes     Comment: 1-2 beers every other day    Review of Systems ROS: Statement: All systems negative except as marked or noted in the HPI; Constitutional: Negative for fever and chills. ; ; Eyes: Negative for eye pain, redness and discharge. ; ; ENMT: Negative for ear pain, hoarseness, nasal congestion, sinus pressure and sore throat. ; ; Cardiovascular: Negative for chest pain, palpitations, diaphoresis, dyspnea and peripheral edema. ; ; Respiratory: Negative for cough, wheezing and stridor. ; ; Gastrointestinal: +abd pain, N/V/D. Negative for blood in stool, hematemesis, jaundice and rectal bleeding. . ; ; Genitourinary: Negative for dysuria, flank pain and hematuria. ; ; Musculoskeletal: Negative for back pain and neck pain. Negative for swelling and trauma.; ; Skin: Negative for pruritus,  rash, abrasions, blisters, bruising and skin lesion.; ; Neuro: Negative for headache, lightheadedness and neck stiffness. Negative for weakness, altered level of consciousness , altered mental status, extremity weakness, paresthesias, involuntary movement, seizure and syncope.     Allergies  Codeine and Pollen extract  Home Medications   Prior to Admission medications   Medication Sig Start Date End Date Taking? Authorizing Provider  dicyclomine (BENTYL) 10 MG capsule Take 1 capsule (10 mg total) by mouth 3 (three) times daily before meals. 04/14/14   Malissa Hippo, MD  dicyclomine (BENTYL) 10 MG capsule Take 10 mg by mouth 4 (four) times daily -  before meals and at bedtime.    Historical Provider, MD  omeprazole (PRILOSEC) 40 MG capsule Take 1 capsule (40 mg total) by mouth daily. 04/09/14   Len Blalock, NP  ondansetron (ZOFRAN) 4 MG tablet Take 1 tablet (4 mg total) by mouth every 6 (six) hours as needed. 03/31/14   Dione Booze, MD   BP 106/72  Pulse 64  Temp(Src) 98.5 F (36.9 C) (Oral)  Resp 16  Ht 6' (1.829 m)  Wt 124 lb (56.246 kg)  BMI 16.81 kg/m2  SpO2 100% Physical Exam 2125: Physical examination:  Nursing notes reviewed; Vital signs and O2 SAT reviewed;  Constitutional: Well developed, Well nourished, Well hydrated, In no acute distress; Head:  Normocephalic, atraumatic; Eyes: EOMI, PERRL, No scleral icterus; ENMT: Mouth and pharynx normal, Mucous membranes moist;  Neck: Supple, Full range of motion, No lymphadenopathy; Cardiovascular: Regular rate and rhythm, No murmur, rub, or gallop; Respiratory: Breath sounds clear & equal bilaterally, No rales, rhonchi, wheezes.  Speaking full sentences with ease, Normal respiratory effort/excursion; Chest: Nontender, Movement normal; Abdomen: Soft, +mild LUQ tenderness to palp. No rebound or guarding. Nondistended, Normal bowel sounds; Genitourinary: No CVA tenderness; Extremities: Pulses normal, No tenderness, No edema, No calf edema or  asymmetry.; Neuro: AA&Ox3, Major CN grossly intact.  Speech clear. No gross focal motor or sensory deficits in extremities.; Skin: Color normal, Warm, Dry.   ED Course  Procedures    MDM  MDM Reviewed: previous chart, nursing note and vitals Reviewed previous: labs and x-ray Interpretation: labs and x-ray    Results for orders placed during the hospital encounter of 04/18/14  CBC WITH DIFFERENTIAL      Result Value Ref Range   WBC 4.2  4.0 - 10.5 K/uL   RBC 4.78  4.22 - 5.81 MIL/uL   Hemoglobin 13.7  13.0 - 17.0 g/dL   HCT 16.139.7  09.639.0 - 04.552.0 %   MCV 83.1  78.0 - 100.0 fL   MCH 28.7  26.0 - 34.0 pg   MCHC 34.5  30.0 - 36.0 g/dL   RDW 40.913.4  81.111.5 - 91.415.5 %   Platelets 270  150 - 400 K/uL   Neutrophils Relative % 50  43 - 77 %   Neutro Abs 2.1  1.7 - 7.7 K/uL   Lymphocytes Relative 41  12 - 46 %   Lymphs Abs 1.7  0.7 - 4.0 K/uL   Monocytes Relative 8  3 - 12 %   Monocytes Absolute 0.3  0.1 - 1.0 K/uL   Eosinophils Relative 1  0 - 5 %   Eosinophils Absolute 0.0  0.0 - 0.7 K/uL   Basophils Relative 0  0 - 1 %   Basophils Absolute 0.0  0.0 - 0.1 K/uL  COMPREHENSIVE METABOLIC PANEL      Result Value Ref Range   Sodium 139  137 - 147 mEq/L   Potassium 4.1  3.7 - 5.3 mEq/L   Chloride 99  96 - 112 mEq/L   CO2 29  19 - 32 mEq/L   Glucose, Bld 99  70 - 99 mg/dL   BUN 10  6 - 23 mg/dL   Creatinine, Ser 7.820.90  0.50 - 1.35 mg/dL   Calcium 9.4  8.4 - 95.610.5 mg/dL   Total Protein 7.4  6.0 - 8.3 g/dL   Albumin 4.3  3.5 - 5.2 g/dL   AST 19  0 - 37 U/L   ALT 8  0 - 53 U/L   Alkaline Phosphatase 71  39 - 117 U/L   Total Bilirubin 0.6  0.3 - 1.2 mg/dL   GFR calc non Af Amer >90  >90 mL/min   GFR calc Af Amer >90  >90 mL/min   Anion gap 11  5 - 15  LIPASE, BLOOD      Result Value Ref Range   Lipase 65 (*) 11 - 59 U/L   [place] Abdomen Complete 04/16/2014   ADDENDUM REPORT: 04/16/2014 11:17  ADDENDUM: The incorrect ultrasound worksheet was connected to this patient ultrasound examination.   INDICATION: Vomiting.  FINDINGS: Gallbladder: No gallstones, wall thickening or pericholecystic fluid. Negative sonographic Murphy sign.  Common bile duct: 3.0 mm  Liver: Normal echogenicity without focal lesion or biliary dilatation.  IVC:  Normal caliber.  Pancreas:  Normal.  Spleen:  Normal  size and echogenicity without focal lesions.  Right kidney: 10.8 cm. Normal renal cortical thickness and echogenicity without focal lesions or hydronephrosis.  Left kidney: 11.0 cm. Normal renal cortical thickness and echogenicity without focal lesions or hydronephrosis.  Abdominal aorta:  Normal calibre.  IMPRESSION: Normal abdominal ultrasound examination.   Electronically Signed   By: Loralie Champagne M.D.   On: 04/16/2014 11:17   Dg Abd Acute W/chest 04/18/2014   CLINICAL DATA:  Abdominal pain and diarrhea.  EXAM: ACUTE ABDOMEN SERIES (ABDOMEN 2 VIEW & CHEST 1 VIEW)  COMPARISON:  None.  FINDINGS: The upright chest x-ray is normal.  Two views of the abdomen demonstrate some residual barium in the colon related to a recent small bowel follow-through examination. No findings for small bowel obstruction or free air. The bony structures are intact.  IMPRESSION: No acute cardiopulmonary findings.  Residual contrast in the colon from recent small bowel follow-through examination. No findings for obstruction or perforation.   Electronically Signed   By: Loralie Champagne M.D.   On: 04/18/2014 22:36    2320:  Pt has tol PO well while in the ED without N/V.  No stooling while in the ED.  VSS. Feels better and wants to go home now. Dx and testing d/w pt and family.  Questions answered.  Verb understanding, agreeable to d/c home with outpt f/u.      Samuel Jester, DO 04/20/14 1810

## 2014-04-18 NOTE — ED Notes (Signed)
Pt took two sips of water and went back to sleep.

## 2014-04-18 NOTE — ED Notes (Signed)
Pt has chronic abdominal pain, currently having N & V, and diarrhea.

## 2014-04-18 NOTE — Discharge Instructions (Signed)
°Emergency Department Resource Guide °1) Find a Doctor and Pay Out of Pocket °Although you won't have to find out who is covered by your insurance plan, it is a good idea to ask around and get recommendations. You will then need to call the office and see if the doctor you have chosen will accept you as a new patient and what types of options they offer for patients who are self-pay. Some doctors offer discounts or will set up payment plans for their patients who do not have insurance, but you will need to ask so you aren't surprised when you get to your appointment. ° °2) Contact Your Local Health Department °Not all health departments have doctors that can see patients for sick visits, but many do, so it is worth a call to see if yours does. If you don't know where your local health department is, you can check in your phone book. The CDC also has a tool to help you locate your state's health department, and many state websites also have listings of all of their local health departments. ° °3) Find a Walk-in Clinic °If your illness is not likely to be very severe or complicated, you may want to try a walk in clinic. These are popping up all over the country in pharmacies, drugstores, and shopping centers. They're usually staffed by nurse practitioners or physician assistants that have been trained to treat common illnesses and complaints. They're usually fairly quick and inexpensive. However, if you have serious medical issues or chronic medical problems, these are probably not your best option. ° °No Primary Care Doctor: °- Call [facility] at  832-8000 - they can help you locate a primary care doctor that  accepts your insurance, provides certain services, etc. °- Physician Referral Service- 1-800-533-3463 ° °Chronic Pain Problems: °Organization         Address  Phone   Notes  °[facility]  (336) 297-2271 Patients need to be referred by their primary care doctor.  ° °Medication  Assistance: °Organization         Address  Phone   Notes  °[place] [facility] [address] °[place], [place] [address] (336) 641-8030 --Must be a resident of [place] °-- Must have NO insurance coverage whatsoever (no Medicaid/ Medicare, etc.) °-- The pt. MUST have a primary care doctor that directs their care regularly and follows them in the community °  °[facility]  (866) 331-1348   °[employer]  (888) 892-1162   ° °Agencies that provide inexpensive medical care: °Organization         Address  Phone   Notes  °[facility]  (336) 832-8035   °[facility]    (336) 832-7272   °[facility] [address] °[place], [place] [address] (336) 832-4777   °[facility] [address], °[place] (336) 271-4999   °[facility]    (336) 373-0678   °[facility]    (336) 272-1050   °[facility] ° [address], [place] Phone:  (336) 832-4444, Fax:  (336) 832-4440 Hours of Operation:  9 am - 6 pm, M-F.  Also accepts Medicaid/Medicare and self-pay.  °[facility] ° [address], [place] Phone: (336) 832-3150, Fax: (336) [phone]. Hours of Operation:  8:30 am - 5:30 pm, M-F.  Also accepts Medicaid and self-pay.  °[facility] [place] [address], [place] Phone: (336) 878-6027   °[facility] [address], [place], [place] (336)723-1848, Ext. 123 Mondays & Thursdays: 7-9 AM.  First 15 patients are seen on a first come, first serve basis. °  ° °Medicaid-accepting [place] Providers: ° °Organization         Address  Phone   Notes  °[facility] [address], [place] (336) 641-2100 Also accepts self-pay patients.  °[facility] [address], [place] ° (336) 856-9996   °[facility] [address], [place]  (336) 288-8857   °[facility] [address] [place] [address], [place] (336) 299-7000   °Veita Bland [address], [place]  ° (336) 373-1557 Only accepts [place] Access Medicaid patients after they have their name applied to their card.  ° °Self-Pay (no insurance) in [place]: ° °Organization         Address  Phone   Notes  °Sickle Cell Patients, [facility] [address], [place] (336) 832-1970   °[facility] [address], [place] (336) 832-4400   °[facility] ° [address], [place] (336) 992-4800   °[facility]/Dr. Osei-Bonsu ° [address] [place] [address], [place] or [address], [place] (336) 841-8500 Phone number for both [place] and [place] locations is the same.  °[facility] [address], [place] (336) 299-0000   °[facility] [address] [place] [address], [place] or [address] (336) 852-7530 °(336) 878-2260   °[facility] [address], [place] (336) 350-1642, phone; (336) 294-5005, fax Sees patients 1st and 3rd Saturday of every month.  Must not qualify for public or private insurance (i.e. Medicaid, Medicare, [place] Health Choice, Veterans' Benefits) • Household income should be no more than 200% of the poverty level •The clinic cannot treat you if you are pregnant or think you are pregnant • Sexually transmitted diseases are not treated at the clinic.  ° ° °Dental Care: °Organization         Address  Phone  Notes  °[place] [facility] [address], [place] (336) 641-6152 Accepts children up to age 21 who are enrolled in Medicaid or [place] Health Choice; pregnant women with a Medicaid card; and children who have applied for Medicaid or [place] Health Choice, but were declined, whose parents can pay a reduced fee at time of service.  °[place]  [facility] [place]  [address], [place] (336) 641-7733 Accepts children up to age 21 who are enrolled in Medicaid or [place] Health Choice; pregnant women with a Medicaid card; and children who have applied for Medicaid or [place] Health Choice, but were declined, whose parents can pay a reduced fee at time of service.  °[facility] ° [address], [place] (336) 641-4533 Patients are seen by appointment only. Walk-ins are not accepted. [facility] will see patients 18 years of age and older. °Monday - Tuesday (8am-5pm) °Most Wednesdays (8:30-5pm) °$30 per visit, cash only  °[facility] ° [address], [place] (336) 641-4533 Patients are seen by appointment only. Walk-ins are not accepted. [facility] will see patients 18 years of age and older. °One   Wednesday Evening (Monthly: Volunteer Based).  $30 per visit, cash only  °[facility]  (919) 537-3737 for adults; Children under age 4, call Graduate Pediatric Dentistry at ([phone]) [phone]. Children aged 4-14, please call (919) [phone] to request a pediatric application. ° Dental services are provided in all areas of dental care including fillings, crowns and bridges, complete and partial dentures, implants, gum treatment, root canals, and extractions. Preventive care is also provided. Treatment is provided to both adults and children. °Patients are selected via a lottery and there is often a waiting list. °  °[facility] [address], °[place] ° (336) 763-8833 [email or web address] °  °[facility] [address], [place], [place] (336)723-1848, Ext. 123 Second and Fourth Thursday of each month, opens at 6:30 AM; Clinic ends at 9 AM.  Patients are seen on a first-come first-served basis, and a limited number are seen during each clinic.  ° °[facility] ° [address], [place], [place] (336) 723-7904    Eligibility Requirements °You must have lived in [place], [place], or [place] counties for at least the last three months. °  You cannot be eligible for state or federal sponsored healthcare insurance, including Veterans Administration, Medicaid, or Medicare. °  You generally cannot be eligible for healthcare insurance through your employer.  °  How to apply: °Eligibility screenings are held every Tuesday and Wednesday afternoon from 1:00 pm until 4:00 pm. You do not need an appointment for the interview!  °[facility] [address], [place], [place] 336-631-2330   °[facility]  336-342-8273   °[facility]  336-703-3100   °[facility]  336-570-6415   ° °Behavioral Health Resources in the Community: °Intensive Outpatient Programs °Organization         Address  Phone  Notes  °[facility] [address], [place], [place] 336-878-6098   °[facility] [address], [place], [place] 336-832-9800   °[facility] [address], [place], [place] ° 336-882-2125   °[facility] [address],  °[place], [place] 1-800-853-5163 or 336-641-4981   °Substance Abuse Resources °Organization         Address  Phone  Notes  °[facility]  336-882-2125   °[facility]  336-784-9470   °[facility]  336-285-9073   °[facility]  336-845-3988   °Residential & Outpatient Substance Abuse Program  1-800-659-3381   °Psychological Services °Organization         Address  Phone  Notes  °[facility]  336- 832-9600   °[facility]  336- 378-7881   °[facility] [address], [place] 1-800-853-5163 or 336-641-4981   ° °Mobile Crisis Teams °Organization         Address  Phone  Notes  °[facility], Mobile Crisis Care Unit  1-877-626-1772   °Assertive °[facility] ° [address]  [place], [place] 336-834-9664   °Sharon DeEsch [address] °[place] 336-554-5454   ° °Self-Help/Support Groups °Organization         Address  Phone             Notes  °[facility] of  - variety of support groups  336- 373-1402 Call for more information  °Narcotics Anonymous (NA), [facility] [address], °[place]  2 meetings at this location  ° °  Residential Treatment Programs Organization         Address  Phone  Notes  [facility] [address],    [place]  [phone]-(340)[phone]   [facility]  [address], [place] [phone], [place], [place] [phone]   [facility] [address], [place] [phone]) [phone] Admissions: 8am-3pm M-F  [facility] [address]    [place], [place] [phone]   [facility] [address], [place], [place] [phone]   [facility] [address]  [place], [place] [phone]   [facility] Intensive Outpatient [address], [place], [place] [phone]   [facility] ([facility] [address]  [place], [place] [phone] or [phone]   [facility] ([facility]) [address] [place], [place] [phone] Accepts Medicaid  [facility] [address]  [place] [phone] Substance Abuse/Addiction Treatment   [facility]         Address  Phone  Notes  [facility]  (641)[phone](888) [phone]   Angie FavaJulie Brannon, PhD [address], [address] [place], [place]   ([phone](336) [phone] or ([phone](336) [phone]   [facility]   [address] [place], [place] [phone](336) [phone]   [facility] [address], [place], [place] [phone](336) [phone] Insurance/Medicaid/sponsorship through [facility] [address]                                    [place], [place] [phone]) [phone] Therapy/tele-psych/case    [facility] [address].   [place], [place] [phone]) [phone]    Dr. Lolly MustacheArfeen  [phone](336) [phone]   [facility]  [employer] [facility] 1) [address], [place] 2) [address], [place] 3)  [address], [place] [phone]) [phone](336) [phone]  [phone](336) [phone]   [facility] Hot[facility] 947-527-1175(336) (934) [phone] or ([phone](336) [phone] (After Hours)       Eat a bland diet, avoiding greasy, fatty, fried foods, as well as spicy and acidic foods or beverages.  Avoid eating within the hour or 2 before going to bed or laying down.  Also avoid teas, colas, coffee, chocolate, pepermint and spearment.  Take over the counter pepcid, one or two tablets by mouth twice a day, for the next 3 to 4 weeks. Take the prescriptions as directed.  Call your regular GI doctor tomorrow to schedule a follow up appointment this week.  Return to the Emergency Department immediately if worsening.

## 2014-04-20 ENCOUNTER — Encounter (INDEPENDENT_AMBULATORY_CARE_PROVIDER_SITE_OTHER): Payer: Self-pay | Admitting: Internal Medicine

## 2014-04-20 ENCOUNTER — Encounter (INDEPENDENT_AMBULATORY_CARE_PROVIDER_SITE_OTHER): Payer: Self-pay | Admitting: *Deleted

## 2014-04-20 ENCOUNTER — Ambulatory Visit (INDEPENDENT_AMBULATORY_CARE_PROVIDER_SITE_OTHER): Payer: 59 | Admitting: Internal Medicine

## 2014-04-20 VITALS — BP 98/58 | HR 72 | Temp 98.1°F | Ht 72.0 in | Wt 124.5 lb

## 2014-04-20 DIAGNOSIS — R1013 Epigastric pain: Secondary | ICD-10-CM

## 2014-04-20 DIAGNOSIS — G8929 Other chronic pain: Secondary | ICD-10-CM

## 2014-04-20 DIAGNOSIS — R1115 Cyclical vomiting syndrome unrelated to migraine: Secondary | ICD-10-CM

## 2014-04-20 NOTE — Progress Notes (Signed)
Subjective:     Patient ID: Aaron Roman, male   DOB: Dec 18, 1990, 23 y.o.   MRN: [id number]  HPI Here today for f/u. Recent hx of epigastric pain, nausea and vomiting. He also has had weight loss of 10 pounds over the past 3 months.  His pain, nausea and vomiting usually occur in the morning and gradually resolved.  Appetite is okay. He eats twice a day. He has not lost any weight since his last visit. Recently seen in the ED for Nausea and vomiting. Noted again his Lipase was slightly elevated.  He was given an Rx for Phenergan which has helped. The nausea occurs every morning. On the 16th of this month, he had to leave work because of the nausea which occurred in the evening.  He has not drank in 4-5 days. He usually drinks a beer or two not every day. He states he is heterosexual. Usually eats his 1st meal of the day around 1-2pm and last meal around 9 or 10 pm.     04/09/2014 US abdomen: abdominal pain, nausea and vomiting. IMPRESSION:  Normal abdominal ultrasound examination.  Electronically Signed  04/16/2014 Small bowel follow thru:  IMPRESSION:  Increased number of mucosal folds within the ileum versus normal.  While the number of folds within the jejunum does not appear  decreased, findings raise a question of jejunization.  This could potentially be seen with celiac disease, less likely  amyloidosis.  04/04/19/2015 EGD: N,V, D.  This finding would not explain patient's symptom complex.  Recommendations:  H. pylori serology.  Continue omeprazole for now.  Dicyclomine 10 mg by mouth 3 times a day before each meal.  Ultrasound of upper abdomen and small bowel follow-through.  CBC    Component Value Date/Time   WBC 4.2 04/18/2014 2153   RBC 4.78 04/18/2014 2153   HGB 13.7 04/18/2014 2153   HCT 39.7 04/18/2014 2153   PLT 270 04/18/2014 2153   MCV 83.1 04/18/2014 2153   MCH 28.7 04/18/2014 2153   MCHC 34.5 04/18/2014 2153   RDW 13.4 04/18/2014 2153   LYMPHSABS 1.7 04/18/2014 2153    MONOABS 0.3 04/18/2014 2153   EOSABS 0.0 04/18/2014 2153   BASOSABS 0.0 04/18/2014 2153   CMP     Component Value Date/Time   NA 139 04/18/2014 2153   K 4.1 04/18/2014 2153   CL 99 04/18/2014 2153   CO2 29 04/18/2014 2153   GLUCOSE 99 04/18/2014 2153   BUN 10 04/18/2014 2153   CREATININE 0.90 04/18/2014 2153   CALCIUM 9.4 04/18/2014 2153   PROT 7.4 04/18/2014 2153   ALBUMIN 4.3 04/18/2014 2153   AST 19 04/18/2014 2153   ALT 8 04/18/2014 2153   ALKPHOS 71 04/18/2014 2153   BILITOT 0.6 04/18/2014 2153   GFRNONAA >90 04/18/2014 2153   GFRAA >90 04/18/2014 2153   Lipase     Component Value Date/Time   LIPASE 65* 04/18/2014 2153       Review of Systems       Past Medical History  Diagnosis Date  . GERD (gastroesophageal reflux disease)   . Chronic abdominal pain   . Cyclical vomiting with nausea   . Chronic diarrhea     Past Surgical History  Procedure Laterality Date  . Wisdom tooth extraction      Allergies  Allergen Reactions  . Codeine Nausea And Vomiting  . Pollen Extract Itching    Current Outpatient Prescriptions on File Prior to Visit  Medication Sig Dispense  Refill  . dicyclomine (BENTYL) 10 MG capsule Take 1 capsule (10 mg total) by mouth 3 (three) times daily before meals.  90 capsule  2  . omeprazole (PRILOSEC) 40 MG capsule Take 1 capsule (40 mg total) by mouth daily.  90 capsule  3  . ondansetron (ZOFRAN) 4 MG tablet Take 1 tablet (4 mg total) by mouth every 6 (six) hours as needed.  20 tablet  0  . promethazine (PHENERGAN) 25 MG suppository Place 1 suppository (25 mg total) rectally every 6 (six) hours as needed for nausea or vomiting.  10 suppository  0  . promethazine (PHENERGAN) 25 MG tablet Take 1 tablet (25 mg total) by mouth every 6 (six) hours as needed for nausea or vomiting.  10 tablet  0   No current facility-administered medications on file prior to visit.            Objective:   Physical Exam  Filed Vitals:   04/20/14 1133  BP: 98/58   Pulse: 72  Temp: 98.1 F (36.7 C)  Height: 6' (1.829 m)  Weight: 124 lb 8 oz (56.473 kg)   Alert and oriented. Skin warm and dry. Oral mucosa is moist.   . Sclera anicteric, conjunctivae is pink. Thyroid not enlarged. No cervical lymphadenopathy. Lungs clear. Heart regular rate and rhythm.  Abdomen is soft. Bowel sounds are positive. No hepatomegaly. No abdominal masses felt. No tenderness.  No edema to lower extremities. Patient is alert and oriented.      Assessment:    Cyclic nausea and vomiting. No cause found yet. EGD, US and small bowel normal.    Plan:    Gastric emptying study and Celiac panel.

## 2014-04-20 NOTE — Patient Instructions (Signed)
Emptying study, Celiac panel.

## 2014-04-21 LAB — GLIADIN ANTIBODIES, SERUM
Gliadin IgA: 13.4 U/mL (ref <20)
Gliadin IgG: 21.9 U/mL — ABNORMAL HIGH (ref <20)

## 2014-04-21 LAB — TISSUE TRANSGLUTAMINASE, IGA: TISSUE TRANSGLUTAMINASE AB, IGA: 3.9 U/mL (ref <20)

## 2014-04-23 ENCOUNTER — Other Ambulatory Visit (HOSPITAL_COMMUNITY): Payer: Self-pay | Admitting: Pulmonary Disease

## 2014-04-23 ENCOUNTER — Ambulatory Visit (HOSPITAL_COMMUNITY)
Admission: RE | Admit: 2014-04-23 | Discharge: 2014-04-23 | Disposition: A | Discharge status: Home / Self Care | Payer: 59 | Source: Ambulatory Visit | Attending: Internal Medicine | Admitting: Internal Medicine

## 2014-04-23 ENCOUNTER — Encounter (HOSPITAL_COMMUNITY): Payer: Self-pay

## 2014-04-23 DIAGNOSIS — R1115 Cyclical vomiting syndrome unrelated to migraine: Secondary | ICD-10-CM | POA: Diagnosis not present

## 2014-04-23 DIAGNOSIS — R948 Abnormal results of function studies of other organs and systems: Secondary | ICD-10-CM | POA: Diagnosis not present

## 2014-04-23 LAB — RETICULIN ANTIBODIES, IGA W TITER: RETICULIN AB, IGA: NEGATIVE

## 2014-04-23 MED ORDER — TECHNETIUM TC 99M SULFUR COLLOID
2.0000 | Freq: Once | INTRAVENOUS | Status: AC | PRN
  Administered 2014-04-23: 2 via ORAL

## 2014-04-26 ENCOUNTER — Telehealth (INDEPENDENT_AMBULATORY_CARE_PROVIDER_SITE_OTHER): Payer: Self-pay | Admitting: *Deleted

## 2014-04-26 ENCOUNTER — Other Ambulatory Visit (INDEPENDENT_AMBULATORY_CARE_PROVIDER_SITE_OTHER): Payer: Self-pay | Admitting: Internal Medicine

## 2014-04-26 DIAGNOSIS — R1115 Cyclical vomiting syndrome unrelated to migraine: Secondary | ICD-10-CM

## 2014-04-26 MED ORDER — PROMETHAZINE HCL 25 MG PO TABS: ORAL_TABLET | ORAL | Status: DC

## 2014-04-26 NOTE — Telephone Encounter (Signed)
Results given to mother. Am still going to talk with Dr. Karilyn Cota

## 2014-04-26 NOTE — Telephone Encounter (Signed)
Rosey Bath, patient's mother, would like to get the test results. She can be reached at the ED [phone].

## 2014-04-26 NOTE — Telephone Encounter (Signed)
Rx eprescribed to his pharmacy

## 2014-04-28 ENCOUNTER — Other Ambulatory Visit (INDEPENDENT_AMBULATORY_CARE_PROVIDER_SITE_OTHER): Payer: Self-pay | Admitting: Internal Medicine

## 2014-04-28 DIAGNOSIS — R109 Unspecified abdominal pain: Secondary | ICD-10-CM

## 2014-04-28 DIAGNOSIS — R112 Nausea with vomiting, unspecified: Secondary | ICD-10-CM

## 2014-04-30 ENCOUNTER — Ambulatory Visit (HOSPITAL_COMMUNITY)
Admission: RE | Admit: 2014-04-30 | Discharge: 2014-04-30 | Disposition: A | Discharge status: Home / Self Care | Payer: 59 | Source: Ambulatory Visit | Attending: Internal Medicine | Admitting: Internal Medicine

## 2014-04-30 DIAGNOSIS — R109 Unspecified abdominal pain: Secondary | ICD-10-CM | POA: Diagnosis present

## 2014-04-30 DIAGNOSIS — R112 Nausea with vomiting, unspecified: Secondary | ICD-10-CM

## 2014-04-30 DIAGNOSIS — R634 Abnormal weight loss: Secondary | ICD-10-CM | POA: Diagnosis not present

## 2014-04-30 DIAGNOSIS — R1110 Vomiting, unspecified: Secondary | ICD-10-CM | POA: Insufficient documentation

## 2014-04-30 DIAGNOSIS — R197 Diarrhea, unspecified: Secondary | ICD-10-CM | POA: Insufficient documentation

## 2014-04-30 MED ORDER — IOHEXOL 300 MG/ML  SOLN
100.0000 mL | Freq: Once | INTRAMUSCULAR | Status: AC | PRN
  Administered 2014-04-30: 100 mL via INTRAVENOUS

## 2014-05-03 ENCOUNTER — Other Ambulatory Visit (INDEPENDENT_AMBULATORY_CARE_PROVIDER_SITE_OTHER): Payer: Self-pay | Admitting: Internal Medicine

## 2014-05-03 DIAGNOSIS — R634 Abnormal weight loss: Secondary | ICD-10-CM

## 2014-05-03 DIAGNOSIS — R197 Diarrhea, unspecified: Secondary | ICD-10-CM

## 2014-05-03 DIAGNOSIS — G8929 Other chronic pain: Secondary | ICD-10-CM

## 2014-05-03 DIAGNOSIS — R1013 Epigastric pain: Principal | ICD-10-CM

## 2014-05-03 DIAGNOSIS — R112 Nausea with vomiting, unspecified: Secondary | ICD-10-CM

## 2014-05-07 ENCOUNTER — Encounter (HOSPITAL_COMMUNITY): Payer: Self-pay

## 2014-05-07 ENCOUNTER — Ambulatory Visit (HOSPITAL_COMMUNITY)
Admission: RE | Admit: 2014-05-07 | Discharge: 2014-05-07 | Disposition: A | Discharge status: Home / Self Care | Payer: 59 | Source: Ambulatory Visit | Attending: Diagnostic Radiology | Admitting: Diagnostic Radiology

## 2014-05-07 DIAGNOSIS — R112 Nausea with vomiting, unspecified: Secondary | ICD-10-CM | POA: Diagnosis not present

## 2014-05-07 DIAGNOSIS — G8929 Other chronic pain: Secondary | ICD-10-CM

## 2014-05-07 DIAGNOSIS — R634 Abnormal weight loss: Secondary | ICD-10-CM | POA: Diagnosis not present

## 2014-05-07 DIAGNOSIS — R1013 Epigastric pain: Secondary | ICD-10-CM | POA: Diagnosis present

## 2014-05-07 DIAGNOSIS — R197 Diarrhea, unspecified: Secondary | ICD-10-CM

## 2014-05-07 MED ORDER — TECHNETIUM TC 99M MEBROFENIN IV KIT
5.0000 | PACK | Freq: Once | INTRAVENOUS | Status: AC | PRN
  Administered 2014-05-07: 5 via INTRAVENOUS

## 2014-05-07 MED ORDER — STERILE WATER FOR INJECTION IJ SOLN
INTRAMUSCULAR | Status: AC
  Administered 2014-05-07: 5 mL via INTRAVENOUS
  Filled 2014-05-07: qty 10

## 2014-05-07 MED ORDER — SINCALIDE 5 MCG IJ SOLR
INTRAMUSCULAR | Status: AC
  Administered 2014-05-07: 1.14 ug via INTRAVENOUS
  Filled 2014-05-07: qty 5

## 2014-05-11 ENCOUNTER — Telehealth (INDEPENDENT_AMBULATORY_CARE_PROVIDER_SITE_OTHER): Payer: Self-pay | Admitting: *Deleted

## 2014-05-11 NOTE — Telephone Encounter (Signed)
Rosey Bath would like to know what the next step is for Aaron Roman? Her return phone number is [phone] or [phone].

## 2014-05-12 NOTE — Telephone Encounter (Signed)
Patient needs office visit with me next

## 2014-05-12 NOTE — Telephone Encounter (Signed)
Dr.Rehman please advise. 

## 2014-05-12 NOTE — Telephone Encounter (Signed)
Forward to Dr.Rehman 

## 2014-05-14 NOTE — Telephone Encounter (Signed)
Apt has been scheduled for 05/17/14 with Dr. Karilyn Cota.

## 2014-05-17 ENCOUNTER — Encounter (INDEPENDENT_AMBULATORY_CARE_PROVIDER_SITE_OTHER): Payer: Self-pay | Admitting: *Deleted

## 2014-05-17 ENCOUNTER — Ambulatory Visit (INDEPENDENT_AMBULATORY_CARE_PROVIDER_SITE_OTHER): Payer: 59 | Admitting: Internal Medicine

## 2014-05-17 ENCOUNTER — Encounter (INDEPENDENT_AMBULATORY_CARE_PROVIDER_SITE_OTHER): Payer: Self-pay | Admitting: Internal Medicine

## 2014-05-17 VITALS — BP 104/68 | HR 70 | Temp 98.2°F | Resp 18 | Ht 72.0 in | Wt 132.8 lb

## 2014-05-17 DIAGNOSIS — R1013 Epigastric pain: Secondary | ICD-10-CM

## 2014-05-17 DIAGNOSIS — R1114 Bilious vomiting: Secondary | ICD-10-CM

## 2014-05-17 DIAGNOSIS — G8929 Other chronic pain: Secondary | ICD-10-CM

## 2014-05-17 MED ORDER — SUCRALFATE 1 G PO TABS: 2.0000 g | ORAL_TABLET | Freq: Every day | ORAL | Status: DC

## 2014-05-17 MED ORDER — DICYCLOMINE HCL 20 MG PO TABS: 20.0000 mg | ORAL_TABLET | Freq: Two times a day (BID) | ORAL | Status: DC

## 2014-05-17 NOTE — Patient Instructions (Signed)
Take omeprazole or Prilosec 30 minutes before evening meal daily. Keep symptom diary for two weeks and send [place] a summary.

## 2014-05-17 NOTE — Progress Notes (Signed)
Presenting complaint;  Followup for nausea vomiting and upper abdominal pain.  Subjective:  Patient is 23 year old African male who presents for reevaluation of nausea vomiting and upper abdominal pain. These symptoms started in September 2014. He has undergone multiple studies as reviewed and are lab data. He can tell any improvement with omeprazole her dicyclomine. Lately he experiences nausea and vomiting every morning. He wakes up with nausea around 7 AM and he vomits even before he eats breakfast. Vomitus is primarily bilious. He has not seen any food particles. Pain occurs in the epigastric region and left upper quadrant and seemed to get worse with meals. He usually does not eat breakfast. He eats lunch at 4 PM and then around 8 PM. He eats both of these meals at work; his meals include primarily fried chicken. His appetite is fair but he is afraid to eat. However he has gained 8 pounds since his last visit 4 weeks ago. He says his job is very stressful. He started his job 15 months ago. He works for 10-11 hours daily for 5-6 days each week. He is having 3-4 soft stools daily. He denies melena or rectal bleeding. He drinks no more than one beer at night before he goes to bed. He does not smoke cigarettes. 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   Current Medications: Outpatient Encounter Prescriptions as of 05/17/2014  Medication Sig  . dicyclomine (BENTYL) 10 MG capsule Take 1 capsule (10 mg total) by mouth 3 (three) times daily before meals.  Marland Kitchen omeprazole (PRILOSEC) 40 MG capsule Take 1 capsule (40 mg total) by mouth daily.  . [DISCONTINUED] ondansetron (ZOFRAN) 4 MG tablet Take 1 tablet (4 mg total) by mouth every 6 (six) hours as needed.  . [DISCONTINUED] promethazine (PHENERGAN) 25 MG suppository Place 1 suppository (25 mg total) rectally every 6 (six) hours as needed for nausea or vomiting.  . [DISCONTINUED] promethazine  (PHENERGAN) 25 MG tablet 12.5mg  every 6 hrs as needed for nausea     Objective: Blood pressure 104/68, pulse 70, temperature 98.2 F (36.8 C), temperature source Oral, resp. rate 18, height 6' (1.829 m), weight 132 lb 12.8 oz (60.238 kg). Patient is alert and in no acute distress. Conjunctiva is pink. Sclera is nonicteric Oropharyngeal mucosa is normal. No neck masses or thyromegaly noted. Cardiac exam with regular rhythm normal S1 and S2. No murmur or gallop noted. Lungs are clear to auscultation. Abdomen abdomen is flat. Bowel sounds are normal. On palpation is soft with mild tenderness in the epigastrium and left upper quadrant without organomegaly or masses.  No LE edema or clubbing noted.  Labs/studies Results: CBC and comprehensive chemistry panel normal on 03/31/2014(ER visit) but serum lipase was mildly elevated at 82. Serum lipase normal at 27 on 04/09/2014. H. pylori serology negative on 04/09/2014. CBC and comprehensive chemistry panel and normal on 04/18/2014( ER visit).  celiac antibody panel on 04/20/2014. Tissue transglutaminase antibody IgA negative. Gliadin antibody IgG mildly positive at 21.9 (normal less than 20). Gliadin antibody IgA 13.4   Ultrasound on 04/16/2014 negative for cholelithiasis. EGD on 04/14/2014 normal other than erosive antral gastritis but H. pylori serology negative as above. SBFT on 04/16/2014 revealing prominent mucosal folds within ileum. GES  With slight delay; 42% of activity in the stomach 2 hours. Abdominopelvic CT on 04/30/2014 unremarkable except descending colonic wall prominence probably due to incomplete distention.  HIDA scan on 05/07/2014 normal with EF  of 66%.   Assessment:  #1. Nausea vomiting and upper abdominal pain of one-year duration unresponsive to PPI and antispasmodic. He has undergone extensive evaluation and only abnormality was prominent ileal folds and borderline gliadin antibody IgG. Imaging studies negative for  SMA syndrome. Symptom complex may be secondary to stress and extended work schedule. If symptoms persist he will need colonoscopy with ileoscopy and possibly repeat EGD with duodenal biopsies. Reassuring to note 8 pound weight gain.  Plan:  Sucralfate 2 g by mouth each bedtime. Take Meprazole 40 mg by mouth daily before evening meal. Dicyclomine 20 mg by mouth twice a day(before rectus and lunch). Progress report in 2 weeks.

## 2014-07-27 ENCOUNTER — Telehealth (INDEPENDENT_AMBULATORY_CARE_PROVIDER_SITE_OTHER): Payer: Self-pay | Admitting: *Deleted

## 2014-07-27 ENCOUNTER — Other Ambulatory Visit (INDEPENDENT_AMBULATORY_CARE_PROVIDER_SITE_OTHER): Payer: Self-pay | Admitting: *Deleted

## 2014-07-27 MED ORDER — PROMETHAZINE HCL 25 MG PO TABS: 25.0000 mg | ORAL_TABLET | Freq: Two times a day (BID) | ORAL | Status: DC | PRN

## 2014-07-27 NOTE — Telephone Encounter (Signed)
A prescription has been sent to his pharmacy.

## 2014-07-27 NOTE — Telephone Encounter (Signed)
Per patient's Mother the patient is throwing up again, and request phenergan.

## 2014-07-27 NOTE — Telephone Encounter (Signed)
Donnie has started vomiting again and would like to get a Rx for Phenergan. Teresa's return phone number is [phone]. She is at work.

## 2014-08-17 ENCOUNTER — Ambulatory Visit (INDEPENDENT_AMBULATORY_CARE_PROVIDER_SITE_OTHER): Payer: 59 | Admitting: Internal Medicine

## 2014-08-18 ENCOUNTER — Telehealth (INDEPENDENT_AMBULATORY_CARE_PROVIDER_SITE_OTHER): Payer: Self-pay | Admitting: *Deleted

## 2014-08-18 NOTE — Telephone Encounter (Signed)
Missed appt and wants to reschedule

## 2014-08-24 NOTE — Telephone Encounter (Signed)
No answer or voice mail on either phone number listed.

## 2014-08-25 ENCOUNTER — Encounter (INDEPENDENT_AMBULATORY_CARE_PROVIDER_SITE_OTHER): Payer: Self-pay | Admitting: *Deleted

## 2014-08-25 ENCOUNTER — Telehealth (INDEPENDENT_AMBULATORY_CARE_PROVIDER_SITE_OTHER): Payer: Self-pay | Admitting: *Deleted

## 2014-08-25 NOTE — Telephone Encounter (Signed)
Aaron Roman NO SHOWED for is apt on 08/17/14 with Dr. Karilyn Cotaehman. A NS letter has been mailed.

## 2014-08-30 NOTE — Telephone Encounter (Signed)
Noted that the patient was a no show. 

## 2014-08-31 ENCOUNTER — Encounter (INDEPENDENT_AMBULATORY_CARE_PROVIDER_SITE_OTHER): Payer: Self-pay | Admitting: Internal Medicine

## 2014-08-31 ENCOUNTER — Ambulatory Visit (INDEPENDENT_AMBULATORY_CARE_PROVIDER_SITE_OTHER): Payer: 59 | Admitting: Internal Medicine

## 2014-08-31 VITALS — BP 98/40 | HR 72 | Temp 97.7°F | Ht 72.0 in | Wt 131.9 lb

## 2014-08-31 DIAGNOSIS — R110 Nausea: Secondary | ICD-10-CM

## 2014-08-31 MED ORDER — PROMETHAZINE HCL 12.5 MG PO TABS: 12.5000 mg | ORAL_TABLET | Freq: Four times a day (QID) | ORAL | Status: DC | PRN

## 2014-08-31 NOTE — Patient Instructions (Addendum)
Rx for PHenergan 12.5mg . OV in 4 months. Small frequent meals.  Try cutting back on fried foods.

## 2014-08-31 NOTE — Telephone Encounter (Signed)
Spoke with Aaron Roman's mother last week. Has an apt scheduled for today, 08/31/14, with Dorene Arerri Setzer, NP.

## 2014-08-31 NOTE — Progress Notes (Signed)
   Subjective:    Patient ID: Aaron Roman, male    DOB: 06/14/1991, 23 y.o.   MRN: [id number]  HPI Here today for f/u of his epigastric pain, nausea and vomiting.  Nausea usually occurs in the morning.   Nausea occurs about 4-5 times a day.  He does have epigastric tenderness sometimes.  He drinks beer at night x 16oz at night.  For meals he eats fried chicken. He is eating one meal a day at night at work. When he gets home he will eat a snack. Appetite is okay. No weight loss. He has a BM every morning.  This am he is not nauseated. No abdominal pain. Smokes marijuana about 3 times a week.  Sitting in the exam room texting. Mother in room.  05/07/2014 HIDA scan: normal.   04/23/2014 Gastric emptying study: IMPRESSION: Examination suggestive of gastroparesis.   04/09/2014 US abdomen: abdominal pain, nausea and vomiting. IMPRESSION:  Normal abdominal ultrasound examination.  Electronically Signed  04/16/2014 Small bowel follow thru:  IMPRESSION:  Increased number of mucosal folds within the ileum versus normal.  While the number of folds within the jejunum does not appear  decreased, findings raise a question of jejunization.  This could potentially be seen with celiac disease, less likely  amyloidosis.  04/04/19/2015 EGD: N,V, D.   Impression: Erosive gastritis otherwise normal EGD. This finding would not explain patient's symptom complex.  Recommendations:  H. pylori serology.  Continue omeprazole for now.     Review of Systems Past Medical History  Diagnosis Date  . GERD (gastroesophageal reflux disease)   . Chronic abdominal pain   . Cyclical vomiting with nausea   . Chronic diarrhea     Past Surgical History  Procedure Laterality Date  . Wisdom tooth extraction    . Esophagogastroduodenoscopy N/A 04/14/2014    Procedure: ESOPHAGOGASTRODUODENOSCOPY (EGD);  Surgeon: Malissa HippoNajeeb U Rehman, MD;  Location: [facility];  Service: Endoscopy;  Laterality: N/A;  830     Allergies  Allergen Reactions  . Codeine Nausea And Vomiting  . Pollen Extract Itching    Current Outpatient Prescriptions on File Prior to Visit  Medication Sig Dispense Refill  . dicyclomine (BENTYL) 20 MG tablet Take 1 tablet (20 mg total) by mouth 2 (two) times daily with breakfast and lunch. 60 tablet 3  . omeprazole (PRILOSEC) 40 MG capsule Take 1 capsule (40 mg total) by mouth daily. 90 capsule 3  . sucralfate (CARAFATE) 1 G tablet Take 2 tablets (2 g total) by mouth at bedtime. (Patient taking differently: Take 1 g by mouth at bedtime. ) 60 tablet 1   No current facility-administered medications on file prior to visit.        Objective:   Physical Exam  Filed Vitals:   08/31/14 0942  Height: 6' (1.829 m)  Weight: 131 lb 14.4 oz (59.829 kg)   Alert and oriented. Skin warm and dry. Oral mucosa is moist.   . Sclera anicteric, conjunctivae is pink. Thyroid not enlarged. No cervical lymphadenopathy. Lungs clear. Heart regular rate and rhythm.  Abdomen is soft. Bowel sounds are positive. No hepatomegaly. No abdominal masses felt. No tenderness.  No edema to lower extremities.          Assessment & Plan:  Nausea which usually occurs in the morning with occasionally vomiting. So far his work up has been negative. Small frequent meals. Rx for phenergan 12.5mg  to his pharmacy. Try avoiding fried foods.  OV in 4 months.

## 2015-01-04 ENCOUNTER — Encounter (INDEPENDENT_AMBULATORY_CARE_PROVIDER_SITE_OTHER): Payer: Self-pay | Admitting: *Deleted

## 2015-01-04 ENCOUNTER — Encounter (INDEPENDENT_AMBULATORY_CARE_PROVIDER_SITE_OTHER): Payer: Self-pay | Admitting: Internal Medicine

## 2015-01-04 ENCOUNTER — Other Ambulatory Visit (HOSPITAL_COMMUNITY): Payer: Self-pay | Admitting: Family Medicine

## 2015-01-04 ENCOUNTER — Ambulatory Visit (INDEPENDENT_AMBULATORY_CARE_PROVIDER_SITE_OTHER): Payer: 59 | Admitting: Internal Medicine

## 2015-01-04 VITALS — BP 100/64 | HR 70 | Temp 98.5°F | Resp 18 | Ht 72.0 in | Wt 124.6 lb

## 2015-01-04 DIAGNOSIS — R1115 Cyclical vomiting syndrome unrelated to migraine: Secondary | ICD-10-CM

## 2015-01-04 DIAGNOSIS — G8929 Other chronic pain: Secondary | ICD-10-CM

## 2015-01-04 DIAGNOSIS — R110 Nausea: Secondary | ICD-10-CM

## 2015-01-04 DIAGNOSIS — R1013 Epigastric pain: Secondary | ICD-10-CM

## 2015-01-04 DIAGNOSIS — G43A0 Cyclical vomiting, not intractable: Secondary | ICD-10-CM

## 2015-01-04 DIAGNOSIS — R112 Nausea with vomiting, unspecified: Secondary | ICD-10-CM | POA: Diagnosis not present

## 2015-01-04 DIAGNOSIS — R634 Abnormal weight loss: Secondary | ICD-10-CM

## 2015-01-04 MED ORDER — PROMETHAZINE HCL 12.5 MG PO TABS: 12.5000 mg | ORAL_TABLET | Freq: Two times a day (BID) | ORAL | Status: DC | PRN

## 2015-01-04 MED ORDER — OMEPRAZOLE 40 MG PO CPDR: 40.0000 mg | DELAYED_RELEASE_CAPSULE | Freq: Every day | ORAL | Status: DC

## 2015-01-04 NOTE — Patient Instructions (Signed)
Keep symptom diary as discussed.  Take omeprazole 40 mg 30 minutes before evening meal daily

## 2015-01-04 NOTE — Progress Notes (Signed)
Presenting complaint;  Follow-up for nausea vomiting epigastric pain diarrhea and weight loss.  Database;  Patient is 24 year old African-American male who was evaluated last year for abdominal pain nausea vomiting and an weight loss. He did not respond to omeprazole. He has undergone multiple studies as follows. Ultrasound on 04/16/2014 was negative for cholelithiasis. He had EGD on 04/14/2014 revealing gastritis and H. pylori serology was negative He underwent small follow-through looking for as in the syndrome. There was no evidence of due to the lack 9 but there was increase number of folds within the small bowel felt to be nonspecific. Celiac antibody panel was obtained was negative. He had solid-phase gastric emptying study on 04/23/2014 which is felt to be mildly abnormal since he had 42% of activity in the stomach at 2 hours. Abdominopelvic CT on 04/30/2014 and no abnormality noted to account for symptoms. Finally he had HIDA scan with [facility] on 05/07/2014 and EF was 66%. He has been maintained on PPI and anti-spasmodics. He was last seen on 08/31/2014.  Subjective:  Patient states he does not feel any better. Since his last visit he has lost 7 pounds. He states all in all he has lost 11-12 pounds. He has nausea every day. Nausea is most pronounced when he wakes up and he vomits 3-4 times a week. Vomits is generally consists of bile and food that ED and the night before. He eats supper at least 3-4 hours before he goes to bed. He is having heartburn at least 2 times a week. He denies hematemesis melena or rectal bleeding. He is having 3-4 stools per day. Most of his stools are loose. He hasn't had a normal stool in several months. He does not drink alcohol. He smokes pack a day. He is working 60 hours a week. 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   Current Medications: Current Outpatient Prescriptions  Medication Sig Dispense Refill  . dicyclomine (BENTYL) 20  MG tablet Take 1 tablet (20 mg total) by mouth 2 (two) times daily with breakfast and lunch. 60 tablet 3  . omeprazole (PRILOSEC) 40 MG capsule Take 1 capsule (40 mg total) by mouth daily. 90 capsule 3  . promethazine (PHENERGAN) 12.5 MG tablet Take 1 tablet (12.5 mg total) by mouth every 6 (six) hours as needed for nausea or vomiting. 30 tablet 0  . sucralfate (CARAFATE) 1 G tablet Take 2 tablets (2 g total) by mouth at bedtime. (Patient taking differently: Take 1 g by mouth at bedtime. ) 60 tablet 1   No current facility-administered medications for this visit.     Objective: Blood pressure 100/64, pulse 70, temperature 98.5 F (36.9 C), temperature source Oral, resp. rate 18, height 6' (1.829 m), weight 124 lb 9.6 oz (56.518 kg). Patient is alert and in no acute distress. Conjunctiva is pink. Sclera is nonicteric Oropharyngeal mucosa is normal. No neck masses or thyromegaly noted. Cardiac exam with regular rhythm normal S1 and S2. No murmur or gallop noted. Lungs are clear to auscultation. Abdomen is flat with normal bowel sounds. On palpation abdomen is soft with mild tenderness at epigastrium and at LLQ.  No LE edema or clubbing noted. He has multiple tattoos.  Labs/studies Results: CBC and comprehensive chemistry panel were normal on 04/18/2014  Celiac disease panel was negative on 04/20/2014     Assessment:  #1. Nausea vomiting and epigastric pain. Patient has undergone extensive workup last year. Gastric emptying study was mildly abnormal there was no evidence  of gastritis peptic ulcer disease cholelithiasis or biliary dyskinesia. He has only responded partially to therapy. #2. Diarrhea presumed to be due to irritable bowel syndrome but he has not responded to therapy once again. He may need colonoscopy to complete his workup. #3. Weight loss is likely secondary to diminished oral intake.    Plan:  New prescription given for omeprazole and promethazine. Fasting cortisol  level. CBC with differential and comprehensive chemistry panel. Upper GI with small bowel follow-through. Patient counseled for need to quit cigarette smoking. If nothing else it should help him gain weight.

## 2015-01-05 ENCOUNTER — Ambulatory Visit (HOSPITAL_COMMUNITY)
Admission: RE | Admit: 2015-01-05 | Discharge: 2015-01-05 | Disposition: A | Discharge status: Home / Self Care | Payer: 59 | Source: Ambulatory Visit | Attending: Internal Medicine | Admitting: Internal Medicine

## 2015-01-05 DIAGNOSIS — K2980 Duodenitis without bleeding: Secondary | ICD-10-CM | POA: Insufficient documentation

## 2015-01-05 DIAGNOSIS — K219 Gastro-esophageal reflux disease without esophagitis: Secondary | ICD-10-CM | POA: Insufficient documentation

## 2015-01-05 DIAGNOSIS — R1031 Right lower quadrant pain: Secondary | ICD-10-CM | POA: Insufficient documentation

## 2015-01-05 DIAGNOSIS — R112 Nausea with vomiting, unspecified: Secondary | ICD-10-CM

## 2015-01-05 DIAGNOSIS — Z87891 Personal history of nicotine dependence: Secondary | ICD-10-CM | POA: Diagnosis not present

## 2015-01-05 DIAGNOSIS — G8929 Other chronic pain: Secondary | ICD-10-CM

## 2015-01-05 DIAGNOSIS — R1115 Cyclical vomiting syndrome unrelated to migraine: Secondary | ICD-10-CM

## 2015-01-05 DIAGNOSIS — R197 Diarrhea, unspecified: Secondary | ICD-10-CM | POA: Insufficient documentation

## 2015-01-05 DIAGNOSIS — R1013 Epigastric pain: Secondary | ICD-10-CM

## 2015-01-05 DIAGNOSIS — R634 Abnormal weight loss: Secondary | ICD-10-CM

## 2015-01-05 DIAGNOSIS — R110 Nausea: Secondary | ICD-10-CM

## 2015-01-10 ENCOUNTER — Emergency Department (HOSPITAL_COMMUNITY)
Admission: EM | Admit: 2015-01-10 | Discharge: 2015-01-10 | Disposition: A | Discharge status: Home / Self Care | Payer: 59 | Attending: Emergency Medicine | Admitting: Emergency Medicine

## 2015-01-10 ENCOUNTER — Encounter (HOSPITAL_COMMUNITY): Payer: Self-pay

## 2015-01-10 DIAGNOSIS — K029 Dental caries, unspecified: Secondary | ICD-10-CM | POA: Diagnosis not present

## 2015-01-10 DIAGNOSIS — Z720 Tobacco use: Secondary | ICD-10-CM | POA: Insufficient documentation

## 2015-01-10 DIAGNOSIS — K088 Other specified disorders of teeth and supporting structures: Secondary | ICD-10-CM | POA: Diagnosis present

## 2015-01-10 DIAGNOSIS — Z79899 Other long term (current) drug therapy: Secondary | ICD-10-CM | POA: Insufficient documentation

## 2015-01-10 DIAGNOSIS — G8929 Other chronic pain: Secondary | ICD-10-CM | POA: Diagnosis not present

## 2015-01-10 DIAGNOSIS — K219 Gastro-esophageal reflux disease without esophagitis: Secondary | ICD-10-CM | POA: Insufficient documentation

## 2015-01-10 MED ORDER — AMOXICILLIN 500 MG PO CAPS: 500.0000 mg | ORAL_CAPSULE | Freq: Three times a day (TID) | ORAL | Status: AC

## 2015-01-10 MED ORDER — TRAMADOL HCL 50 MG PO TABS: 50.0000 mg | ORAL_TABLET | Freq: Four times a day (QID) | ORAL | Status: DC | PRN

## 2015-01-10 NOTE — Discharge Instructions (Signed)
Dental Caries °Dental caries is tooth decay. This decay can cause a hole in teeth (cavity) that can get bigger and deeper over time. °HOME CARE °· Brush and floss your teeth. Do this at least two times a day. °· Use a fluoride toothpaste. °· Use a mouth rinse if told by your dentist or doctor. °· Eat less sugary and starchy foods. Drink less sugary drinks. °· Avoid snacking often on sugary and starchy foods. Avoid sipping often on sugary drinks. °· Keep regular checkups and cleanings with your dentist. °· Use fluoride supplements if told by your dentist or doctor. °· Allow fluoride to be applied to teeth if told by your dentist or doctor. °Document Released: 05/29/2008 Document Revised: 01/04/2014 Document Reviewed: 08/22/2012 °Exit[employer]® Patient Information ©2015 [employer]. This information is not intended to replace advice given to you by your health care provider. Make sure you discuss any questions you have with your health care provider. ° °

## 2015-01-10 NOTE — ED Notes (Signed)
Pt c/o toothache x  1 month. 

## 2015-01-11 NOTE — ED Provider Notes (Signed)
CSN: [id number]     Arrival date & time 01/10/15  [id number] History   First MD Initiated Contact with Patient 01/10/15 [phone]     Chief Complaint  Patient presents with  . Dental Pain     (Consider location/radiation/quality/duration/timing/severity/associated sxs/prior Treatment) The history is provided by the patient.   Aaron Roman is a 24 y.o. male  Presenting with a one month history of worsening dental pain There has been no fevers, chills, nausea or vomiting, also no complaint of difficulty swallowing, although chewing makes pain worse. Positive cold sensitivity.  The patient has taken tylenol without relief of symptoms.        Past Medical History  Diagnosis Date  . GERD (gastroesophageal reflux disease)   . Chronic abdominal pain   . Cyclical vomiting with nausea   . Chronic diarrhea    Past Surgical History  Procedure Laterality Date  . Wisdom tooth extraction    . Esophagogastroduodenoscopy N/A 04/14/2014    Procedure: ESOPHAGOGASTRODUODENOSCOPY (EGD);  Surgeon: Malissa HippoNajeeb U Rehman, MD;  Location: [facility];  Service: Endoscopy;  Laterality: N/A;  830   No family history on file. History  Substance Use Topics  . Smoking status: Current Every Day Smoker -- 0.50 packs/day for 10 years    Types: Cigarettes  . Smokeless tobacco: Never Used  . Alcohol Use: 0.0 oz/week    0 Standard drinks or equivalent per week     Comment: 1-2 beers every other day    Review of Systems  Constitutional: Negative for fever.  HENT: Positive for dental problem. Negative for facial swelling and sore throat.   Respiratory: Negative for shortness of breath.   Musculoskeletal: Negative for neck pain and neck stiffness.      Allergies  Codeine and Pollen extract  Home Medications   Prior to Admission medications   Medication Sig Start Date End Date Taking? Authorizing Provider  dicyclomine (BENTYL) 20 MG tablet Take 1 tablet (20 mg total) by mouth 2 (two) times daily with breakfast and  lunch. 05/17/14  Yes Malissa HippoNajeeb U Rehman, MD  omeprazole (PRILOSEC) 40 MG capsule Take 1 capsule (40 mg total) by mouth daily. 01/04/15  Yes Malissa HippoNajeeb U Rehman, MD  promethazine (PHENERGAN) 12.5 MG tablet Take 1 tablet (12.5 mg total) by mouth 2 (two) times daily as needed for nausea or vomiting. 01/04/15  Yes Malissa HippoNajeeb U Rehman, MD  sucralfate (CARAFATE) 1 G tablet Take 2 tablets (2 g total) by mouth at bedtime. 05/17/14  Yes Malissa HippoNajeeb U Rehman, MD  amoxicillin (AMOXIL) 500 MG capsule Take 1 capsule (500 mg total) by mouth 3 (three) times daily. 01/10/15 01/20/15  Burgess AmorJulie Kabria Hetzer, PA-C  traMADol (ULTRAM) 50 MG tablet Take 1 tablet (50 mg total) by mouth every 6 (six) hours as needed for moderate pain. 01/10/15   Burgess AmorJulie Malyah Ohlrich, PA-C   BP 119/87 mmHg  Pulse 63  Temp(Src) 98 F (36.7 C) (Oral)  Resp 18  Ht 6' (1.829 m)  Wt 124 lb (56.246 kg)  BMI 16.81 kg/m2  SpO2 100% Physical Exam  Constitutional: He is oriented to person, place, and time. He appears well-developed and well-nourished. No distress.  HENT:  Head: Normocephalic and atraumatic.  Right Ear: Tympanic membrane and external ear normal.  Left Ear: Tympanic membrane and external ear normal.  Mouth/Throat: Oropharynx is clear and moist and mucous membranes are normal. No oral lesions. No trismus in the jaw. Abnormal dentition. Dental caries present.    Eyes: Conjunctivae are normal.  Neck:  Normal range of motion. Neck supple.  Cardiovascular: Normal rate and normal heart sounds.   Pulmonary/Chest: Effort normal.  Abdominal: He exhibits no distension.  Musculoskeletal: Normal range of motion.  Lymphadenopathy:    He has no cervical adenopathy.  Neurological: He is alert and oriented to person, place, and time.  Skin: Skin is warm and dry. No erythema.  Psychiatric: He has a normal mood and affect.    ED Course  Procedures (including critical care time) Labs Review Labs Reviewed - No data to display  Imaging Review No results found.   EKG  Interpretation None      MDM   Final diagnoses:  Dental cavity    Probable early localized abscess. Pt prescribed amoxil, tramadol as he does not tolerate nsaids due to gerd.  Dental referrals given.  The patient appears reasonably screened and/or stabilized for discharge and I doubt any other medical condition or other [facility] requiring further screening, evaluation, or treatment in the ED at this time prior to discharge.      Burgess AmorJulie Socrates Cahoon, PA-C 01/11/15 2223  Bethann BerkshireJoseph Zammit, MD 01/12/15 [phone]

## 2015-01-19 ENCOUNTER — Telehealth (INDEPENDENT_AMBULATORY_CARE_PROVIDER_SITE_OTHER): Payer: Self-pay | Admitting: *Deleted

## 2015-01-19 NOTE — Telephone Encounter (Signed)
Talked with the patient and he states that he started having abdominal pain 3 days ago. He describes it as sharp . He is also experiencing N&V, diarrhea. Afebrile.  Dr.Rehman called and made aware. He states that he would like for the patient to have a repeat EGD/Colonoscopy. Forwarded to RobinsonAnn to arrange per NUR.  Patient has not been called back at this time.

## 2015-01-19 NOTE — Telephone Encounter (Signed)
Donelle 1st was returning Dr. Patty Sermonsehman's call, then he called back stating he is having the same stomach pain as before. This has been going on for two days and nothing he is taking seems to help. The return phone number is [phone].

## 2015-01-19 NOTE — Telephone Encounter (Signed)
Patient was called and made aware that Dr.Rehman wanted him to have EGD/Colonoscopy. He was advised that Aaron Roman would call him when this was arranged. If his pain became unbearable he should go to the ED for further evaluation.

## 2015-01-20 ENCOUNTER — Telehealth (INDEPENDENT_AMBULATORY_CARE_PROVIDER_SITE_OTHER): Payer: Self-pay | Admitting: *Deleted

## 2015-01-20 ENCOUNTER — Other Ambulatory Visit (INDEPENDENT_AMBULATORY_CARE_PROVIDER_SITE_OTHER): Payer: Self-pay | Admitting: *Deleted

## 2015-01-20 DIAGNOSIS — R197 Diarrhea, unspecified: Secondary | ICD-10-CM

## 2015-01-20 DIAGNOSIS — R1084 Generalized abdominal pain: Secondary | ICD-10-CM

## 2015-01-20 DIAGNOSIS — Z1211 Encounter for screening for malignant neoplasm of colon: Secondary | ICD-10-CM

## 2015-01-20 DIAGNOSIS — R112 Nausea with vomiting, unspecified: Secondary | ICD-10-CM

## 2015-01-20 NOTE — Telephone Encounter (Signed)
TCS/EGD sch'd 01/28/15, patient aware and will stop by office to pick up instruction sheet

## 2015-01-20 NOTE — Telephone Encounter (Signed)
Patient needs trilyte 

## 2015-01-21 MED ORDER — PEG 3350-KCL-NA BICARB-NACL 420 G PO SOLR: 4000.0000 mL | Freq: Once | ORAL | Status: DC

## 2015-01-28 ENCOUNTER — Other Ambulatory Visit (INDEPENDENT_AMBULATORY_CARE_PROVIDER_SITE_OTHER): Payer: Self-pay | Admitting: *Deleted

## 2015-01-28 ENCOUNTER — Encounter (HOSPITAL_COMMUNITY)
Admission: RE | Disposition: A | Discharge status: Home / Self Care | Payer: Self-pay | Source: Ambulatory Visit | Attending: Internal Medicine

## 2015-01-28 ENCOUNTER — Encounter (INDEPENDENT_AMBULATORY_CARE_PROVIDER_SITE_OTHER): Payer: Self-pay | Admitting: *Deleted

## 2015-01-28 ENCOUNTER — Ambulatory Visit (HOSPITAL_COMMUNITY)
Admission: RE | Admit: 2015-01-28 | Discharge: 2015-01-28 | Disposition: A | Discharge status: Home / Self Care | Payer: 59 | Source: Ambulatory Visit | Attending: Internal Medicine | Admitting: Internal Medicine

## 2015-01-28 DIAGNOSIS — R1084 Generalized abdominal pain: Secondary | ICD-10-CM

## 2015-01-28 DIAGNOSIS — R112 Nausea with vomiting, unspecified: Secondary | ICD-10-CM

## 2015-01-28 SURGERY — COLONOSCOPY
Anesthesia: Moderate Sedation

## 2015-01-28 NOTE — Progress Notes (Signed)
Patient arrived for procedure but has not done any colon prep for his colonoscopy. Procedure cancelled until prep can be done.

## 2015-02-04 ENCOUNTER — Ambulatory Visit (HOSPITAL_COMMUNITY)
Admission: RE | Admit: 2015-02-04 | Discharge: 2015-02-04 | Disposition: A | Discharge status: Home / Self Care | Payer: 59 | Source: Ambulatory Visit | Attending: Internal Medicine | Admitting: Internal Medicine

## 2015-02-04 ENCOUNTER — Encounter (HOSPITAL_COMMUNITY)
Admission: RE | Disposition: A | Discharge status: Home / Self Care | Payer: Self-pay | Source: Ambulatory Visit | Attending: Internal Medicine

## 2015-02-04 DIAGNOSIS — K219 Gastro-esophageal reflux disease without esophagitis: Secondary | ICD-10-CM | POA: Diagnosis not present

## 2015-02-04 DIAGNOSIS — G8929 Other chronic pain: Secondary | ICD-10-CM | POA: Insufficient documentation

## 2015-02-04 DIAGNOSIS — R110 Nausea: Secondary | ICD-10-CM

## 2015-02-04 DIAGNOSIS — R112 Nausea with vomiting, unspecified: Secondary | ICD-10-CM | POA: Diagnosis present

## 2015-02-04 DIAGNOSIS — R197 Diarrhea, unspecified: Secondary | ICD-10-CM | POA: Diagnosis not present

## 2015-02-04 DIAGNOSIS — Z79899 Other long term (current) drug therapy: Secondary | ICD-10-CM | POA: Diagnosis not present

## 2015-02-04 DIAGNOSIS — K2950 Unspecified chronic gastritis without bleeding: Secondary | ICD-10-CM | POA: Diagnosis not present

## 2015-02-04 DIAGNOSIS — K2970 Gastritis, unspecified, without bleeding: Secondary | ICD-10-CM | POA: Diagnosis not present

## 2015-02-04 DIAGNOSIS — Z79891 Long term (current) use of opiate analgesic: Secondary | ICD-10-CM | POA: Diagnosis not present

## 2015-02-04 DIAGNOSIS — R1013 Epigastric pain: Secondary | ICD-10-CM | POA: Diagnosis not present

## 2015-02-04 DIAGNOSIS — F17210 Nicotine dependence, cigarettes, uncomplicated: Secondary | ICD-10-CM | POA: Insufficient documentation

## 2015-02-04 DIAGNOSIS — R933 Abnormal findings on diagnostic imaging of other parts of digestive tract: Secondary | ICD-10-CM | POA: Diagnosis not present

## 2015-02-04 DIAGNOSIS — R1084 Generalized abdominal pain: Secondary | ICD-10-CM

## 2015-02-04 DIAGNOSIS — K2980 Duodenitis without bleeding: Secondary | ICD-10-CM | POA: Diagnosis not present

## 2015-02-04 HISTORY — PX: COLONOSCOPY: SHX5424

## 2015-02-04 HISTORY — PX: ESOPHAGOGASTRODUODENOSCOPY: SHX5428

## 2015-02-04 SURGERY — COLONOSCOPY
Anesthesia: Moderate Sedation

## 2015-02-04 MED ORDER — STERILE WATER FOR IRRIGATION IR SOLN
Status: DC | PRN
  Administered 2015-02-04: 08:00:00

## 2015-02-04 MED ORDER — PROMETHAZINE HCL 25 MG PO TABS: 25.0000 mg | ORAL_TABLET | Freq: Two times a day (BID) | ORAL | Status: DC | PRN

## 2015-02-04 MED ORDER — MIDAZOLAM HCL 5 MG/5ML IJ SOLN
INTRAMUSCULAR | Status: AC
  Filled 2015-02-04: qty 10

## 2015-02-04 MED ORDER — MEPERIDINE HCL 50 MG/ML IJ SOLN
INTRAMUSCULAR | Status: AC
  Filled 2015-02-04: qty 1

## 2015-02-04 MED ORDER — SODIUM CHLORIDE 0.9 % IV SOLN
INTRAVENOUS | Status: DC
  Administered 2015-02-04: 07:00:00 via INTRAVENOUS

## 2015-02-04 MED ORDER — MIDAZOLAM HCL 5 MG/5ML IJ SOLN
INTRAMUSCULAR | Status: AC
  Filled 2015-02-04: qty 5

## 2015-02-04 MED ORDER — MEPERIDINE HCL 50 MG/ML IJ SOLN
INTRAMUSCULAR | Status: DC | PRN
  Administered 2015-02-04 (×2): 25 mg via INTRAVENOUS

## 2015-02-04 MED ORDER — MIDAZOLAM HCL 5 MG/5ML IJ SOLN
INTRAMUSCULAR | Status: DC | PRN
  Administered 2015-02-04: 2 mg via INTRAVENOUS
  Administered 2015-02-04: 1 mg via INTRAVENOUS
  Administered 2015-02-04 (×7): 2 mg via INTRAVENOUS

## 2015-02-04 MED ORDER — BUTAMBEN-TETRACAINE-BENZOCAINE 2-2-14 % EX AERO
INHALATION_SPRAY | CUTANEOUS | Status: DC | PRN
  Administered 2015-02-04: 2 via TOPICAL

## 2015-02-04 NOTE — Op Note (Signed)
EGD PROCEDURE REPORT  PATIENT:  Aaron Roman  MR#:  [id number] Birthdate:  04/13/1991, 24 y.o., male Endoscopist:  Dr. Malissa HippoNajeeb U. Maelee Hoot, MD  Procedure Date: 02/04/2015  Procedure:   EGD & Colonoscopy  Indications:  Patient is 24 year old African-American male with one-year history of epigastric pain nausea and vomiting as well as diarrhea and weight loss. He's undergone extensive evaluation and workup has been negative. He has not responded to PPI and anti-spasmodic. Here upper GI with small bowel follow-through recently. This study suggested duodenitis. Last EGD was in August 2015 and due to an exam was normal. He is undergoing diagnostic EGD and colonoscopy.            Informed Consent:  The risks, benefits, alternatives & imponderables which include, but are not limited to, bleeding, infection, perforation, drug reaction and potential missed lesion have been reviewed.  The potential for biopsy, lesion removal, esophageal dilation, etc. have also been discussed.  Questions have been answered.  All parties agreeable.  Please see history & physical in medical record for more information.  Medications:  Demerol 50 mg IV Versed 17 mg IV Cetacaine spray topically for oropharyngeal anesthesia  EGD  Description of procedure:  The endoscope was introduced through the mouth and advanced to the second portion of the duodenum without difficulty or limitations. The mucosal surfaces were surveyed very carefully during advancement of the scope and upon withdrawal.  Findings:  Esophagus:  Mucosa of the esophagus was normal. GE junction was unremarkable. GEJ:  44 cm Stomach:  Stomach was empty and distended very well with insufflation. Folds in the proximal stomach are normal. Examination of mucosa at gastric body was normal. Patchy antral erythema and edema noted along with few small erosions. No ulcer crater was found. Pyloric channel was patent. Fundus and cardia were unremarkable. Duodenum:  Normal  bulbar and post bulbar mucosa.  Therapeutic/Diagnostic Maneuvers Performed:   Biopsies taken from post bulbar duodenal mucosa for routine histology. Biopsies also taken from antral mucosa for routine histology.  COLONOSCOPY Description of procedure:  After a digital rectal exam was performed, that colonoscope was advanced from the anus through the rectum and colon to the area of the cecum, ileocecal valve and appendiceal orifice. The cecum was deeply intubated. These structures were well-seen and photographed for the record. From the level of the cecum and ileocecal valve, the scope was slowly and cautiously withdrawn. The mucosal surfaces were carefully surveyed utilizing scope tip to flexion to facilitate fold flattening as needed. The scope was pulled down into the rectum where a thorough exam including retroflexion was performed. Terminal ileum was also examined.  Findings:  Prep satisfactory. Normal mucosa of terminal ileum. Biopsies taken for routine histology. Normal mucosa of cecum, ascending colon, hepatic flexure, transverse colon, splenic flexure, descending and sigmoid colon. Random biopsies taken from mucosa of sigmoid colon. Normal rectal mucosa and anal rectal junction.  Stool sample was obtained for culture and O&P.   Therapeutic/Diagnostic Maneuvers Performed:  See above  Complications:  None  Cecal Withdrawal Time:  8 minutes  Impression:  EGD findings; Erosive antral gastritis otherwise normal EGD. Biopsies taken from antrum and post bulbar duodenum.  Colonoscopy findings; Normal terminal ileoscopy and colonoscopy. Random biopsies taken from mucosa of terminal ileum and sigmoid colon. Stool sample taken for culture and O&P.  Recommendations:  Standard instructions given. Promethazine prescription changed to 25 mg by mouth twice a day when necessary. I will be contacting patient results of stool studies and biopsy  and further recommendations.  Kaelene Elliston U   02/04/2015 8:32 AM  CC: Dr. Bonnetta Barry PCP Per Patient & Dr. No ref. provider found

## 2015-02-04 NOTE — H&P (Signed)
Aaron Roman is an 24 y.o. male.   Chief Complaint: Patient is here for EGD and colonoscopy. HPI: Patient is 24 year old African-American male who presents with one-year history of epigastric pain nausea vomiting as well as diarrhea. He has undergone extensive evaluation. He has not responded to omeprazole and dicyclomine. He had EGD in August 2015 revealing gastritis. H. pylori serology and celiac antibody panel has been negative. Evaluation gallbladder has been negative and CT was unremarkable. He had upper GI with small bowel follow-through recently. Studies suggested duodenitis but no evidence of SMA. He has lost 15 pounds since his symptoms began. He has 3-4 episodes of vomiting every week usually after lunch. Vomiting occurs within few minutes of his meal. He denies hematemesis Aaron Roman or rectal bleeding. He has 3-4 stools every day.  Past Medical History  Diagnosis Date  . GERD (gastroesophageal reflux disease)   . Chronic abdominal pain   . Cyclical vomiting with nausea   . Chronic diarrhea     Past Surgical History  Procedure Laterality Date  . Wisdom tooth extraction    . Esophagogastroduodenoscopy N/A 04/14/2014    Procedure: ESOPHAGOGASTRODUODENOSCOPY (EGD);  Surgeon: Malissa HippoNajeeb U Menno Vanbergen, MD;  Location: [facility];  Service: Endoscopy;  Laterality: N/A;  830    No family history on file. Social History:  reports that he has been smoking Cigarettes.  He has a 5 pack-year smoking history. He has never used smokeless tobacco. He reports that he drinks alcohol. He reports that he does not use illicit drugs.  Allergies:  Allergies  Allergen Reactions  . Codeine Nausea And Vomiting  . Pollen Extract Itching    Medications Prior to Admission  Medication Sig Dispense Refill  . dicyclomine (BENTYL) 20 MG tablet Take 1 tablet (20 mg total) by mouth 2 (two) times daily with breakfast and lunch. 60 tablet 3  . omeprazole (PRILOSEC) 40 MG capsule Take 1 capsule (40 mg total) by mouth  daily. 90 capsule 1  . polyethylene glycol-electrolytes (NULYTELY/GOLYTELY) 420 G solution Take 4,000 mLs by mouth once. 4000 mL 0  . promethazine (PHENERGAN) 12.5 MG tablet Take 1 tablet (12.5 mg total) by mouth 2 (two) times daily as needed for nausea or vomiting. 20 tablet 1  . sucralfate (CARAFATE) 1 G tablet Take 2 tablets (2 g total) by mouth at bedtime. 60 tablet 1  . traMADol (ULTRAM) 50 MG tablet Take 1 tablet (50 mg total) by mouth every 6 (six) hours as needed for moderate pain. 15 tablet 0    No results found for this or any previous visit (from the past 48 hour(s)). No results found.  ROS  Blood pressure 115/77, temperature 97.8 F (36.6 C), temperature source Oral, resp. rate 14, height 6' (1.829 m), weight 124 lb (56.246 kg), SpO2 100 %. Physical Exam  Constitutional:  Well-developed African-American male in NAD  HENT:  Mouth/Throat: Oropharynx is clear and moist.  Eyes: Conjunctivae are normal. No scleral icterus.  Neck: No thyromegaly present.  Cardiovascular: Normal rate, regular rhythm and normal heart sounds.   No murmur heard. Respiratory: Effort normal and breath sounds normal.  GI:  Flat abdomen. It is soft with mild midepigastric tenderness. No organomegaly or masses.  Musculoskeletal: He exhibits no edema.  Lymphadenopathy:    He has no cervical adenopathy.  Neurological: He is alert.  Skin: Skin is warm.  Multiple tattoos over extremities chest and abdomen.     Assessment/Plan Chronic nausea vomiting and epigastric pain with negative workup. Duodenitis on  upper GI series. Chronic diarrhea. Diagnostic EGD and colonoscopy.  Romonda Parker U 02/04/2015, 7:34 AM

## 2015-02-04 NOTE — Discharge Instructions (Signed)
Resume usual medications and diet. Promethazine 25 mg by mouth twice a day when necessary. No driving for 24 hours. Physician will call with results of biopsy and stool tests.  Colonoscopy, Care After Refer to this sheet in the next few weeks. These instructions provide you with information on caring for yourself after your procedure. Your health care provider may also give you more specific instructions. Your treatment has been planned according to current medical practices, but problems sometimes occur. Call your health care provider if you have any problems or questions after your procedure. WHAT TO EXPECT AFTER THE PROCEDURE  After your procedure, it is typical to have the following:  A small amount of blood in your stool.  Moderate amounts of gas and mild abdominal cramping or bloating. HOME CARE INSTRUCTIONS  Do not drive, operate machinery, or sign important documents for 24 hours.  You may shower and resume your regular physical activities, but move at a slower pace for the first 24 hours.  Take frequent rest periods for the first 24 hours.  Walk around or put a warm pack on your abdomen to help reduce abdominal cramping and bloating.  Drink enough fluids to keep your urine clear or pale yellow.  You may resume your normal diet as instructed by your health care provider. Avoid heavy or fried foods that are hard to digest.  Avoid drinking alcohol for 24 hours or as instructed by your health care provider.  Only take over-the-counter or prescription medicines as directed by your health care provider.  If a tissue sample (biopsy) was taken during your procedure:  Do not take aspirin or blood thinners for 7 days, or as instructed by your health care provider.  Do not drink alcohol for 7 days, or as instructed by your health care provider.  Eat soft foods for the first 24 hours. SEEK MEDICAL CARE IF: You have persistent spotting of blood in your stool 2-3 days after the  procedure. SEEK IMMEDIATE MEDICAL CARE IF:  You have more than a small spotting of blood in your stool.  You pass large blood clots in your stool.  Your abdomen is swollen (distended).  You have nausea or vomiting.  You have a fever.  You have increasing abdominal pain that is not relieved with medicine. Document Released: 04/03/2004 Document Revised: 06/10/2013 Document Reviewed: 04/27/2013 [facility] Patient Information 2015 [place], [place]. This information is not intended to replace advice given to you by your health care provider. Make sure you discuss any questions you have with your health care provider.

## 2015-02-05 ENCOUNTER — Encounter (HOSPITAL_COMMUNITY): Payer: Self-pay | Admitting: *Deleted

## 2015-02-05 ENCOUNTER — Emergency Department (HOSPITAL_COMMUNITY): Payer: 59

## 2015-02-05 ENCOUNTER — Emergency Department (HOSPITAL_COMMUNITY)
Admission: EM | Admit: 2015-02-05 | Discharge: 2015-02-05 | Disposition: A | Discharge status: Home / Self Care | Payer: 59 | Attending: Emergency Medicine | Admitting: Emergency Medicine

## 2015-02-05 DIAGNOSIS — I4510 Unspecified right bundle-branch block: Secondary | ICD-10-CM | POA: Insufficient documentation

## 2015-02-05 DIAGNOSIS — R079 Chest pain, unspecified: Secondary | ICD-10-CM | POA: Diagnosis present

## 2015-02-05 DIAGNOSIS — K2970 Gastritis, unspecified, without bleeding: Secondary | ICD-10-CM | POA: Diagnosis not present

## 2015-02-05 DIAGNOSIS — G8929 Other chronic pain: Secondary | ICD-10-CM | POA: Diagnosis not present

## 2015-02-05 DIAGNOSIS — Z79899 Other long term (current) drug therapy: Secondary | ICD-10-CM | POA: Diagnosis not present

## 2015-02-05 DIAGNOSIS — R0789 Other chest pain: Secondary | ICD-10-CM | POA: Insufficient documentation

## 2015-02-05 DIAGNOSIS — Z9889 Other specified postprocedural states: Secondary | ICD-10-CM | POA: Insufficient documentation

## 2015-02-05 DIAGNOSIS — K219 Gastro-esophageal reflux disease without esophagitis: Secondary | ICD-10-CM | POA: Diagnosis not present

## 2015-02-05 DIAGNOSIS — Z720 Tobacco use: Secondary | ICD-10-CM | POA: Diagnosis not present

## 2015-02-05 LAB — CBC WITH DIFFERENTIAL/PLATELET
BASOS ABS: 0 10*3/uL (ref 0.0–0.1)
Basophils Relative: 0 % (ref 0–1)
EOS PCT: 3 % (ref 0–5)
Eosinophils Absolute: 0.2 10*3/uL (ref 0.0–0.7)
HEMATOCRIT: 41.8 % (ref 39.0–52.0)
Hemoglobin: 14.2 g/dL (ref 13.0–17.0)
Lymphocytes Relative: 21 % (ref 12–46)
Lymphs Abs: 1.5 10*3/uL (ref 0.7–4.0)
MCH: 28.6 pg (ref 26.0–34.0)
MCHC: 34 g/dL (ref 30.0–36.0)
MCV: 84.3 fL (ref 78.0–100.0)
Monocytes Absolute: 0.6 10*3/uL (ref 0.1–1.0)
Monocytes Relative: 9 % (ref 3–12)
NEUTROS PCT: 67 % (ref 43–77)
Neutro Abs: 4.7 10*3/uL (ref 1.7–7.7)
Platelets: 317 10*3/uL (ref 150–400)
RBC: 4.96 MIL/uL (ref 4.22–5.81)
RDW: 13 % (ref 11.5–15.5)
WBC: 6.9 10*3/uL (ref 4.0–10.5)

## 2015-02-05 LAB — COMPREHENSIVE METABOLIC PANEL
ALT: 12 U/L — ABNORMAL LOW (ref 17–63)
AST: 20 U/L (ref 15–41)
Albumin: 4.7 g/dL (ref 3.5–5.0)
Alkaline Phosphatase: 78 U/L (ref 38–126)
Anion gap: 8 (ref 5–15)
BUN: 11 mg/dL (ref 6–20)
CO2: 29 mmol/L (ref 22–32)
CREATININE: 0.95 mg/dL (ref 0.61–1.24)
Calcium: 9.4 mg/dL (ref 8.9–10.3)
Chloride: 100 mmol/L — ABNORMAL LOW (ref 101–111)
GFR calc Af Amer: >60 mL/min (ref >60)
GFR calc non Af Amer: >60 mL/min (ref >60)
Glucose, Bld: 106 mg/dL — ABNORMAL HIGH (ref 65–99)
Potassium: 4.3 mmol/L (ref 3.5–5.1)
Sodium: 137 mmol/L (ref 135–145)
TOTAL PROTEIN: 7.9 g/dL (ref 6.5–8.1)
Total Bilirubin: 0.8 mg/dL (ref 0.3–1.2)

## 2015-02-05 LAB — TROPONIN I: Troponin I: <0.03 ng/mL (ref <0.031)

## 2015-02-05 MED ORDER — MORPHINE SULFATE 4 MG/ML IJ SOLN
4.0000 mg | Freq: Once | INTRAMUSCULAR | Status: AC
  Administered 2015-02-05: 4 mg via INTRAVENOUS
  Filled 2015-02-05: qty 1

## 2015-02-05 MED ORDER — TRAMADOL HCL 50 MG PO TABS: 50.0000 mg | ORAL_TABLET | Freq: Four times a day (QID) | ORAL | Status: DC | PRN

## 2015-02-05 MED ORDER — GI COCKTAIL ~~LOC~~
30.0000 mL | Freq: Once | ORAL | Status: AC
  Administered 2015-02-05: 30 mL via ORAL
  Filled 2015-02-05: qty 30

## 2015-02-05 MED ORDER — PANTOPRAZOLE SODIUM 40 MG IV SOLR
40.0000 mg | Freq: Once | INTRAVENOUS | Status: AC
  Administered 2015-02-05: 40 mg via INTRAVENOUS
  Filled 2015-02-05: qty 40

## 2015-02-05 NOTE — Discharge Instructions (Signed)
Read the information below.  Use the prescribed medication as directed.  Please discuss all new medications with your pharmacist.  You may return to the Emergency Department at any time for worsening condition or any new symptoms that concern you.   If you develop worsening chest pain, shortness of breath, fever, you pass out, or become weak or dizzy, return to the ER for a recheck.      Chest Pain (Nonspecific) It is often hard to give a specific diagnosis for the cause of chest pain. There is always a chance that your pain could be related to something serious, such as a heart attack or a blood clot in the lungs. You need to follow up with your health care provider for further evaluation. CAUSES   Heartburn.  Pneumonia or bronchitis.  Anxiety or stress.  Inflammation around your heart (pericarditis) or lung (pleuritis or pleurisy).  A blood clot in the lung.  A collapsed lung (pneumothorax). It can develop suddenly on its own (spontaneous pneumothorax) or from trauma to the chest.  Shingles infection (herpes zoster virus). The chest wall is composed of bones, muscles, and cartilage. Any of these can be the source of the pain.  The bones can be bruised by injury.  The muscles or cartilage can be strained by coughing or overwork.  The cartilage can be affected by inflammation and become sore (costochondritis). DIAGNOSIS  Lab tests or other studies may be needed to find the cause of your pain. Your health care provider may have you take a test called an ambulatory electrocardiogram (ECG). An ECG records your heartbeat patterns over a 24-hour period. You may also have other tests, such as:  Transthoracic echocardiogram (TTE). During echocardiography, sound waves are used to evaluate how blood flows through your heart.  Transesophageal echocardiogram (TEE).  Cardiac monitoring. This allows your health care provider to monitor your heart rate and rhythm in real time.  Holter monitor.  This is a portable device that records your heartbeat and can help diagnose heart arrhythmias. It allows your health care provider to track your heart activity for several days, if needed.  Stress tests by exercise or by giving medicine that makes the heart beat faster. TREATMENT   Treatment depends on what may be causing your chest pain. Treatment may include:  Acid blockers for heartburn.  Anti-inflammatory medicine.  Pain medicine for inflammatory conditions.  Antibiotics if an infection is present.  You may be advised to change lifestyle habits. This includes stopping smoking and avoiding alcohol, caffeine, and chocolate.  You may be advised to keep your head raised (elevated) when sleeping. This reduces the chance of acid going backward from your stomach into your esophagus. Most of the time, nonspecific chest pain will improve within 2-3 days with rest and mild pain medicine.  HOME CARE INSTRUCTIONS   If antibiotics were prescribed, take them as directed. Finish them even if you start to feel better.  For the next few days, avoid physical activities that bring on chest pain. Continue physical activities as directed.  Do not use any tobacco products, including cigarettes, chewing tobacco, or electronic cigarettes.  Avoid drinking alcohol.  Only take medicine as directed by your health care provider.  Follow your health care provider's suggestions for further testing if your chest pain does not go away.  Keep any follow-up appointments you made. If you do not go to an appointment, you could develop lasting (chronic) problems with pain. If there is any problem keeping an appointment,  call to reschedule. SEEK MEDICAL CARE IF:   Your chest pain does not go away, even after treatment.  You have a rash with blisters on your chest.  You have a fever. SEEK IMMEDIATE MEDICAL CARE IF:   You have increased chest pain or pain that spreads to your arm, neck, jaw, back, or  abdomen.  You have shortness of breath.  You have an increasing cough, or you cough up blood.  You have severe back or abdominal pain.  You feel nauseous or vomit.  You have severe weakness.  You faint.  You have chills. This is an emergency. Do not wait to see if the pain will go away. Get medical help at once. Call your local emergency services (911 in [place] Do not drive yourself to the hospital. MAKE SURE YOU:   Understand these instructions.  Will watch your condition.  Will get help right away if you are not doing well or get worse. Document Released: 05/30/2005 Document Revised: 08/25/2013 Document Reviewed: 03/25/2008 [facility] Patient Information 2015 [place], [place]. This information is not intended to replace advice given to you by your health care provider. Make sure you discuss any questions you have with your health care provider.  Gastritis, Adult Gastritis is soreness and swelling (inflammation) of the lining of the stomach. Gastritis can develop as a sudden onset (acute) or long-term (chronic) condition. If gastritis is not treated, it can lead to stomach bleeding and ulcers. CAUSES  Gastritis occurs when the stomach lining is weak or damaged. Digestive juices from the stomach then inflame the weakened stomach lining. The stomach lining may be weak or damaged due to viral or bacterial infections. One common bacterial infection is the Helicobacter pylori infection. Gastritis can also result from excessive alcohol consumption, taking certain medicines, or having too much acid in the stomach.  SYMPTOMS  In some cases, there are no symptoms. When symptoms are present, they may include:  Pain or a burning sensation in the upper abdomen.  Nausea.  Vomiting.  An uncomfortable feeling of fullness after eating. DIAGNOSIS  Your caregiver may suspect you have gastritis based on your symptoms and a physical exam. To determine the cause of your gastritis, your caregiver  may perform the following:  Blood or stool tests to check for the H pylori bacterium.  Gastroscopy. A thin, flexible tube (endoscope) is passed down the esophagus and into the stomach. The endoscope has a light and camera on the end. Your caregiver uses the endoscope to view the inside of the stomach.  Taking a tissue sample (biopsy) from the stomach to examine under a microscope. TREATMENT  Depending on the cause of your gastritis, medicines may be prescribed. If you have a bacterial infection, such as an H pylori infection, antibiotics may be given. If your gastritis is caused by too much acid in the stomach, H2 blockers or antacids may be given. Your caregiver may recommend that you stop taking aspirin, ibuprofen, or other nonsteroidal anti-inflammatory drugs (NSAIDs). HOME CARE INSTRUCTIONS  Only take over-the-counter or prescription medicines as directed by your caregiver.  If you were given antibiotic medicines, take them as directed. Finish them even if you start to feel better.  Drink enough fluids to keep your urine clear or pale yellow.  Avoid foods and drinks that make your symptoms worse, such as:  Caffeine or alcoholic drinks.  Chocolate.  Peppermint or mint flavorings.  Garlic and onions.  Spicy foods.  Citrus fruits, such as oranges, lemons, or limes.  Tomato-based foods such as sauce, chili, salsa, and pizza.  Fried and fatty foods.  Eat small, frequent meals instead of large meals. SEEK IMMEDIATE MEDICAL CARE IF:   You have black or dark red stools.  You vomit blood or material that looks like coffee grounds.  You are unable to keep fluids down.  Your abdominal pain gets worse.  You have a fever.  You do not feel better after 1 week.  You have any other questions or concerns. MAKE SURE YOU:  Understand these instructions.  Will watch your condition.  Will get help right away if you are not doing well or get worse. Document Released:  08/14/2001 Document Revised: 02/19/2012 Document Reviewed: 10/03/2011 [facility] Patient Information 2015 [place], [place]. This information is not intended to replace advice given to you by your health care provider. Make sure you discuss any questions you have with your health care provider.

## 2015-02-05 NOTE — ED Notes (Addendum)
Pain to center chest began at 0730 as soon as he got out of bed. Pt denies any other symptoms or radiation of pain at this time. Pt had colonoscopy and EGD yesterday with biopsies, per pt. 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

## 2015-02-05 NOTE — ED Provider Notes (Signed)
CSN: [id number]     Arrival date & time 02/05/15  0941 History   First MD Initiated Contact with Patient 02/05/15 [phone]     Chief Complaint  Patient presents with  . Chest Pain     (Consider location/radiation/quality/duration/timing/severity/associated sxs/prior Treatment) The history is provided by the patient and medical records.     Pt with hx severe GERD with EGD and colonoscopy yesterday showing erosive antral gastritis p/w constant waxing and waning central chest pain that began around 7:30am today.  The pain is described as pressure and cramping.  He also has a sore throat and cough productive of yellow sputum.  Denies fevers, chills, SOB, abdominal pain, vomiting.  Had normal BM this morning.  After his procedures yesterday he ate chicken nuggets and french fries.  He has had no medication changes.  No hx similar symptoms.   Past Medical History  Diagnosis Date  . GERD (gastroesophageal reflux disease)   . Chronic abdominal pain   . Cyclical vomiting with nausea   . Chronic diarrhea    Past Surgical History  Procedure Laterality Date  . Wisdom tooth extraction    . Esophagogastroduodenoscopy N/A 04/14/2014    Procedure: ESOPHAGOGASTRODUODENOSCOPY (EGD);  Surgeon: Malissa Hippo, MD;  [facility]: [facility];  Service: Endoscopy;  Laterality: N/A;  830  . Colonoscopy     No family history on file. History  Substance Use Topics  . Smoking status: Current Every Day Smoker -- 0.50 packs/day for 10 years    Types: Cigarettes  . Smokeless tobacco: Never Used  . Alcohol Use: 0.0 oz/week    0 Standard drinks or equivalent per week     Comment: 1-2 beers every other day    Review of Systems  All other systems reviewed and are negative.     Allergies  Codeine and Pollen extract  Home Medications   Prior to Admission medications   Medication Sig Start Date End Date Taking? Authorizing Provider  dicyclomine (BENTYL) 20 MG tablet Take 1 tablet (20 mg total) by mouth 2  (two) times daily with breakfast and lunch. 05/17/14   Malissa Hippo, MD  omeprazole (PRILOSEC) 40 MG capsule Take 1 capsule (40 mg total) by mouth daily. 01/04/15   Malissa Hippo, MD  promethazine (PHENERGAN) 25 MG tablet Take 1 tablet (25 mg total) by mouth 2 (two) times daily as needed for nausea or vomiting. 02/04/15   Malissa Hippo, MD  sucralfate (CARAFATE) 1 G tablet Take 2 tablets (2 g total) by mouth at bedtime. 05/17/14   Malissa Hippo, MD  traMADol (ULTRAM) 50 MG tablet Take 1 tablet (50 mg total) by mouth every 6 (six) hours as needed for moderate pain. 01/10/15   Burgess Amor, PA-C   There were no vitals taken for this visit. Physical Exam  Constitutional: He appears well-developed and well-nourished. No distress.  HENT:  Head: Normocephalic and atraumatic.  Neck: Neck supple.  Cardiovascular: Normal rate, regular rhythm and intact distal pulses.   Pulmonary/Chest: Effort normal and breath sounds normal. No respiratory distress. He has no wheezes. He has no rales.  Abdominal: Soft. He exhibits no distension and no mass. There is no tenderness. There is no rebound and no guarding.  Musculoskeletal: He exhibits no edema.  Neurological: He is alert. He exhibits normal muscle tone.  Skin: He is not diaphoretic.  Nursing note and vitals reviewed.   ED Course  Procedures (including critical care time) Labs Review Labs Reviewed  COMPREHENSIVE  METABOLIC PANEL - Abnormal; Notable for the following:    Chloride 100 (*)    Glucose, Bld 106 (*)    ALT 12 (*)    All other components within normal limits  TROPONIN I  CBC WITH DIFFERENTIAL/PLATELET    Imaging Review Dg Chest 2 View  02/05/2015   CLINICAL DATA:  Centralized chest pain beginning this morning. No injury. Current smoker.  EXAM: CHEST  2 VIEW  COMPARISON:  04/18/2014  FINDINGS: The heart size and mediastinal contours are within normal limits. Both lungs are clear. No pleural effusion or pneumothorax. The visualized skeletal  structures are unremarkable.  IMPRESSION: No active cardiopulmonary disease.   Electronically Signed   By: Amie Portlandavid  Ormond M.D.   On: 02/05/2015 10:35     EKG Interpretation   Date/Time:  Saturday February 05 2015 09:48:51 EDT Ventricular Rate:  85 PR Interval:  124 QRS Duration: 118 QT Interval:  376 QTC Calculation: 447 R Axis:   108 Text Interpretation:  Sinus rhythm Incomplete right bundle branch block  Since last tracing the conduction abnormality is new Confirmed by Effie ShyWENTZ   MD, ELLIOTT [phone]([id number]) on 02/05/2015 10:14:11 AM       11:38 AM Discussed pt and results with Dr Effie ShyWentz.  Plan is for GI follow up, pt to be advised about EKG change, will need to follow up with PCP.    MDM   Final diagnoses:  Atypical chest pain  Gastritis  RBBB    Afebrile, nontoxic patient with central chest pain that was constant from the time he woke up.  Hx EGD and colonoscopy yesterday, hx severe gastritis, pt is otherwise healthy.  Has some sore throat and mild cough, suspect this is from EGD.  I also suspect that the chest pain is related to recent EGD and possibly eating high fat/fried foods following the procedure.  CXR negative.  EKG is abnormal with incomplete right bundle branch block but pt has no syncope, lightheadedness/dizziness - I discussed this with Dr Effie ShyWentz and it is highly unlikely this is related to patient's pain.  Troponin negative.  I doubt ACS in this patient. Heart score 0.  Doubt PE.  Doubt PNA.  Suspect esophageal irritation from scoping.  No e/o esophageal rupture on xray.    D/C home with tramadol, close GI follow up (he is to receive phone call with results from GI doctor today per mother, I have encouraged her to discuss symptoms with his doctor).   Discussed result, findings, treatment, and follow up  with patient.  Pt given return precautions.  Pt verbalizes understanding and agrees with plan.        Trixie Dredgemily Jaan Fischel, PA-C 02/05/15 1634  Mancel BaleElliott Wentz, MD 02/06/15 [phone]

## 2015-02-07 LAB — OVA AND PARASITE EXAMINATION: Ova and parasites: NONE SEEN

## 2015-02-08 ENCOUNTER — Other Ambulatory Visit (INDEPENDENT_AMBULATORY_CARE_PROVIDER_SITE_OTHER): Payer: Self-pay | Admitting: Internal Medicine

## 2015-02-08 ENCOUNTER — Encounter (HOSPITAL_COMMUNITY): Payer: Self-pay | Admitting: Internal Medicine

## 2015-02-08 LAB — STOOL CULTURE

## 2015-02-08 MED ORDER — IMIPRAMINE HCL 25 MG PO TABS: 25.0000 mg | ORAL_TABLET | Freq: Every day | ORAL | Status: DC

## 2015-02-10 ENCOUNTER — Ambulatory Visit: Payer: 59 | Admitting: Cardiovascular Disease

## 2015-03-02 ENCOUNTER — Encounter: Payer: Self-pay | Admitting: *Deleted

## 2015-03-02 ENCOUNTER — Telehealth: Payer: Self-pay | Admitting: *Deleted

## 2015-03-02 ENCOUNTER — Ambulatory Visit (INDEPENDENT_AMBULATORY_CARE_PROVIDER_SITE_OTHER): Payer: 59 | Admitting: Cardiovascular Disease

## 2015-03-02 ENCOUNTER — Encounter: Payer: Self-pay | Admitting: Cardiovascular Disease

## 2015-03-02 VITALS — BP 102/78 | HR 79 | Ht 72.0 in | Wt 130.0 lb

## 2015-03-02 DIAGNOSIS — I359 Nonrheumatic aortic valve disorder, unspecified: Secondary | ICD-10-CM | POA: Diagnosis not present

## 2015-03-02 DIAGNOSIS — Z87898 Personal history of other specified conditions: Secondary | ICD-10-CM | POA: Diagnosis not present

## 2015-03-02 DIAGNOSIS — Z9289 Personal history of other medical treatment: Secondary | ICD-10-CM

## 2015-03-02 DIAGNOSIS — R079 Chest pain, unspecified: Secondary | ICD-10-CM | POA: Diagnosis not present

## 2015-03-02 DIAGNOSIS — K2100 Gastro-esophageal reflux disease with esophagitis, without bleeding: Secondary | ICD-10-CM

## 2015-03-02 NOTE — Patient Instructions (Signed)
Your physician has requested that you have an echocardiogram. Echocardiography is a painless test that uses sound waves to create images of your heart. It provides your doctor with information about the size and shape of your heart and how well your heart's chambers and valves are working. This procedure takes approximately one hour. There are no restrictions for this procedure. Office will contact with results via phone or letter.   Continue all current medications. Your physician wants you to follow up in:  2 years.  You will receive a reminder letter in the mail one-two months in advance.  If you don't receive a letter, please call our office to schedule the follow up appointment    

## 2015-03-02 NOTE — Telephone Encounter (Signed)
Pt was just seen today and [facility] phone to say pt is there now for cleaning and needs to be pre medicated. Pt did not mention this to dentist or provider. Will forward to Dr. Purvis SheffieldKoneswaran

## 2015-03-02 NOTE — Telephone Encounter (Signed)
[facility] [phone]

## 2015-03-02 NOTE — Telephone Encounter (Signed)
Does not need antibiotics from cardiac standpoint.

## 2015-03-02 NOTE — Progress Notes (Signed)
Patient ID: Aaron Roman, male   DOB: Oct 13, 1990, 24 y.o.   MRN: [id number]       CARDIOLOGY CONSULT NOTE  Patient ID: Aaron Roman MRN: [id number] DOB/AGE: 03/14/1991 23 y.o.  Admit date: (Not on file) Primary Physician No PCP Per Patient  Reason for Consultation: abnormal ECG, chest pain  HPI: The patient is a 24 year old male with a history of severe GERD and tobacco use who was evaluated for chest pain earlier this month. CBC, basic metabolic panel, and troponin were all normal. Chest x-ray showed no active cardiac pulmonary disease. ECG demonstrated normal sinus rhythm with an incomplete right bundle-branch block, QRS duration 118 ms. EGD on 6/3 demonstrated erosive antral gastritis. Chest pain was deemed noncardiac in etiology.  He currently feels well and denies chest pain, palpitations, lightheadedness, dizziness, leg swelling, and shortness of breath. 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 She is uncertain if this was a bicuspid aortic valve. She also tells me that his father had an aortic valve malformation and underwent aortic valve replacement surgery at age 681 and died on 02-06-2015 of cancer.   Allergies  Allergen Reactions  . Codeine Nausea And Vomiting  . Pollen Extract Itching    Current Outpatient Prescriptions  Medication Sig Dispense Refill  . dicyclomine (BENTYL) 20 MG tablet Take 1 tablet (20 mg total) by mouth 2 (two) times daily with breakfast and lunch. 60 tablet 3  . imipramine (TOFRANIL) 25 MG tablet Take 1 tablet (25 mg total) by mouth at bedtime. 30 tablet 5  . omeprazole (PRILOSEC) 40 MG capsule Take 1 capsule (40 mg total) by mouth daily. 90 capsule 1  . promethazine (PHENERGAN) 25 MG tablet Take 1 tablet (25 mg total) by mouth 2 (two) times daily as needed for nausea or vomiting. 20 tablet 1  . sucralfate (CARAFATE) 1 G tablet  Take 2 tablets (2 g total) by mouth at bedtime. 60 tablet 1   No current facility-administered medications for this visit.    Past Medical History  Diagnosis Date  . GERD (gastroesophageal reflux disease)   . Chronic abdominal pain   . Cyclical vomiting with nausea   . Chronic diarrhea     Past Surgical History  Procedure Laterality Date  . Wisdom tooth extraction    . Esophagogastroduodenoscopy N/A 04/14/2014    Procedure: ESOPHAGOGASTRODUODENOSCOPY (EGD);  Surgeon: Malissa Hippo, MD;  [facility]: [facility];  Service: Endoscopy;  Laterality: N/A;  830  . Colonoscopy    . Colonoscopy N/A 02/04/2015    Procedure: COLONOSCOPY;  Surgeon: Malissa Hippo, MD;  [facility]: [facility];  Service: Endoscopy;  Laterality: N/A;  145 - moved to 7:30 - Ann notified pt  . Esophagogastroduodenoscopy N/A 02/04/2015    Procedure: ESOPHAGOGASTRODUODENOSCOPY (EGD);  Surgeon: Malissa Hippo, MD;  [facility]: [facility];  Service: Endoscopy;  Laterality: N/A;    History   Social History  . Marital Status: Single    Spouse Name: N/A  . Number of Children: N/A  . Years of Education: N/A   Occupational History  . Not on file.   Social History Main Topics  . Smoking status: Current Every Day Smoker -- 0.50 packs/day for 10 years    Types: Cigarettes  . Smokeless tobacco: Never Used  . Alcohol Use: 0.0 oz/week    0 Standard drinks  or equivalent per week     Comment: 1-2 beers every other day  . Drug Use: No     Comment: marijuana  . Sexual Activity: Not on file   Other Topics Concern  . Not on file   Social History Narrative       Prior to Admission medications   Medication Sig Start Date End Date Taking? Authorizing Provider  dicyclomine (BENTYL) 20 MG tablet Take 1 tablet (20 mg total) by mouth 2 (two) times daily with breakfast and lunch. 05/17/14  Yes Malissa HippoNajeeb U Rehman, MD  imipramine (TOFRANIL) 25 MG tablet Take 1 tablet (25 mg total) by mouth at bedtime. 02/08/15  Yes Malissa HippoNajeeb U  Rehman, MD  omeprazole (PRILOSEC) 40 MG capsule Take 1 capsule (40 mg total) by mouth daily. 01/04/15  Yes Malissa HippoNajeeb U Rehman, MD  promethazine (PHENERGAN) 25 MG tablet Take 1 tablet (25 mg total) by mouth 2 (two) times daily as needed for nausea or vomiting. 02/04/15  Yes Malissa HippoNajeeb U Rehman, MD  sucralfate (CARAFATE) 1 G tablet Take 2 tablets (2 g total) by mouth at bedtime. 05/17/14  Yes Malissa HippoNajeeb U Rehman, MD     Review of systems complete and found to be negative unless listed above in HPI     Physical exam Blood pressure 102/78, pulse 79, height 6' (1.829 m), weight 130 lb (58.968 kg), SpO2 98 %. General: NAD Neck: No JVD, no thyromegaly or thyroid nodule.  Lungs: Clear to auscultation bilaterally with normal respiratory effort. CV: Nondisplaced PMI. Regular rate and rhythm, normal S1/S2, no S3/S4, no murmur.  No peripheral edema.  No carotid bruit.  Normal pedal pulses.  Abdomen: Soft, nontender, no hepatosplenomegaly, no distention.  Skin: Intact without lesions or rashes. Multiple tattoos. Neurologic: Alert and oriented x 3.  Psych: Normal affect. Extremities: No clubbing or cyanosis.  HEENT: Normal.   ECG: Most recent ECG reviewed.  Labs:   Lab Results  Component Value Date   WBC 6.9 02/05/2015   HGB 14.2 02/05/2015   HCT 41.8 02/05/2015   MCV 84.3 02/05/2015   PLT 317 02/05/2015   No results for input(s): NA, K, CL, CO2, BUN, CREATININE, CALCIUM, PROT, BILITOT, ALKPHOS, ALT, AST, GLUCOSE in the last 168 hours.  Invalid input(s): LABALBU Lab Results  Component Value Date   TROPONINI <0.03 02/05/2015   No results found for: CHOL No results found for: HDL No results found for: LDLCALC No results found for: TRIG No results found for: CHOLHDL No results found for: LDLDIRECT       Studies: No results found.  ASSESSMENT AND PLAN:  1. Aortic valve malformation: No appreciable murmur on exam. Will obtain echocardiogram to further clarify. Asymptomatic at present.  2.  Chest pain: Noncardiac in etiology and likely related to GERD and antral gastritis. Asymptomatic at present.  3. Abnormal ECG: Incomplete RBBB and unremarkable. Will monitor.  Dispo: f/u 2 years.   Signed: Prentice DockerSuresh Curtis Uriarte, M.D., F.A.C.C.  03/02/2015, 10:25 AM

## 2015-03-14 ENCOUNTER — Ambulatory Visit: Payer: 59 | Admitting: Cardiovascular Disease

## 2015-03-16 ENCOUNTER — Encounter (INDEPENDENT_AMBULATORY_CARE_PROVIDER_SITE_OTHER): Payer: Self-pay | Admitting: Internal Medicine

## 2015-03-16 ENCOUNTER — Ambulatory Visit (INDEPENDENT_AMBULATORY_CARE_PROVIDER_SITE_OTHER): Payer: 59 | Admitting: Internal Medicine

## 2015-03-16 VITALS — BP 90/50 | HR 72 | Temp 97.8°F | Ht 72.0 in | Wt 131.6 lb

## 2015-03-16 DIAGNOSIS — R1013 Epigastric pain: Secondary | ICD-10-CM | POA: Diagnosis not present

## 2015-03-16 DIAGNOSIS — R634 Abnormal weight loss: Secondary | ICD-10-CM

## 2015-03-16 DIAGNOSIS — R110 Nausea: Secondary | ICD-10-CM | POA: Diagnosis not present

## 2015-03-16 NOTE — Patient Instructions (Addendum)
Continue the Imipramine. OV 3 months

## 2015-03-16 NOTE — Progress Notes (Signed)
Subjective:    Patient ID: Aaron Roman, male    DOB: 09/30/1990, 24 y.o.   MRN: [id number]  HPI Here today for f/u after recent EGD/Colonoscopy for epigastric pain, nausea and vomiting, diarrhea and weight loss.. He has had an extensive work up which has all been negative. He underwent an EGD and colonoscopy last month.  Colonoscopy was normal and EGD showed erosive antral gastritis, otherwise normal. Dr. Karilyn Cotaehman started him on Impramine 25mg  at hs.  He says he feels 85% better since starting the Impramine. He has epigastric pain about twice a week. Occasionally hs nausea. His appetite is much better. He has gained  7.5 pounds since he was last seen. He is having a BM twice a day. Stools are formed.     Celiac panel negative. O and P negative 02/04/2015 Stool culture negative. Cortisol negative.  02/04/2015 EGD & Colonoscopy  Indications: Patient is 24 year old African-American male with one-year history of epigastric pain nausea and vomiting as well as diarrhea and weight loss. He's undergone extensive evaluation and workup has been negative. He has not responded to PPI and anti-spasmodic. Here upper GI with small bowel follow-through recently. This study suggested duodenitis. Last EGD was in August 2015 and due to an exam was normal. He is undergoing diagnostic EGD and colonoscopy.  Impression:  EGD findings; Erosive antral gastritis otherwise normal EGD. Biopsies taken from antrum and post bulbar duodenum.  Colonoscopy findings; Normal terminal ileoscopy and colonoscopy. Random biopsies taken from mucosa of terminal ileum and sigmoid colon. Stool sample taken for culture and O&P.  Stool culture and O&P are negative. Gastric biopsy shows mild gastritis but no evidence of H. pylori infection. Duodenal, ileal and colonic biopsies are normal. Results reviewed with  patient. Will treat with low-dose imipramine(25 mg by mouth daily at bedtime). Patient will need office visit in 4-6 weeks on one Wednesday afternoon.  01/05/2015 Small bowel follow thru IMPRESSION: Mild duodenitis.  Otherwise negative exam.  05/07/2014 HIDA scan: normal  04/30/2014 CT abdomen/pelvis with CM: Nausea, weight loss, epigastric pain:  IMPRESSION: 1. No acute findings or definite explanation for the patient's symptoms. 2. Descending colonic wall prominence is likely related to incomplete distention. There is no surrounding inflammatory change. No evidence of bowel obstruction or extraluminal fluid collection.  04/23/2014 GES:   IMPRESSION: Examination suggestive of gastroparesis.  Review of Systems Past Medical History  Diagnosis Date  . GERD (gastroesophageal reflux disease)   . Chronic abdominal pain   . Cyclical vomiting with nausea   . Chronic diarrhea     Past Surgical History  Procedure Laterality Date  . Wisdom tooth extraction    . Esophagogastroduodenoscopy N/A 04/14/2014    Procedure: ESOPHAGOGASTRODUODENOSCOPY (EGD);  Surgeon: Malissa HippoNajeeb U Rehman, MD;  [facility];  Service: Endoscopy;  Laterality: N/A;  830  . Colonoscopy    . Colonoscopy N/A 02/04/2015    Procedure: COLONOSCOPY;  Surgeon: Malissa HippoNajeeb U Rehman, MD;  [facility];  Service: Endoscopy;  Laterality: N/A;  145 - moved to 7:30 - Ann notified pt  . Esophagogastroduodenoscopy N/A 02/04/2015    Procedure: ESOPHAGOGASTRODUODENOSCOPY (EGD);  Surgeon: Malissa HippoNajeeb U Rehman, MD;  [facility]: [facility];  Service: Endoscopy;  Laterality: N/A;    Allergies  Allergen Reactions  . Codeine Nausea And Vomiting  . Pollen Extract Itching    Current Outpatient Prescriptions on File Prior to Visit  Medication Sig Dispense Refill  . dicyclomine (BENTYL) 20 MG tablet Take 1 tablet (20 mg  total) by mouth 2 (two) times daily with breakfast and lunch. 60 tablet 3  . imipramine (TOFRANIL) 25 MG  tablet Take 1 tablet (25 mg total) by mouth at bedtime. 30 tablet 5  . omeprazole (PRILOSEC) 40 MG capsule Take 1 capsule (40 mg total) by mouth daily. 90 capsule 1  . promethazine (PHENERGAN) 25 MG tablet Take 1 tablet (25 mg total) by mouth 2 (two) times daily as needed for nausea or vomiting. 20 tablet 1  . sucralfate (CARAFATE) 1 G tablet Take 2 tablets (2 g total) by mouth at bedtime. 60 tablet 1   No current facility-administered medications on file prior to visit.        Objective:   Physical ExamBlood pressure 90/50, pulse 72, temperature 97.8 F (36.6 C), height 6' (1.829 m), weight 131 lb 9.6 oz (59.693 kg). Alert and oriented. Skin warm and dry. Oral mucosa is moist.   . Sclera anicteric, conjunctivae is pink. Thyroid not enlarged. No cervical lymphadenopathy. Lungs clear. Heart regular rate and rhythm.  Abdomen is soft. Bowel sounds are positive. No hepatomegaly. No abdominal masses felt. No tenderness.  No edema to lower extremities.          Assessment & Plan:  Nausea, weight loss, epigastric pain. He feels much better since starting the Imipramine. Rarely has nausea.  He has gained 7.5 pounds since his last visit. Continue the Impramine. OV in 3 months. OV 6 months.

## 2015-03-24 ENCOUNTER — Other Ambulatory Visit: Payer: 59

## 2015-03-31 ENCOUNTER — Other Ambulatory Visit: Payer: 59

## 2015-04-28 ENCOUNTER — Other Ambulatory Visit: Payer: 59

## 2015-05-11 ENCOUNTER — Ambulatory Visit (HOSPITAL_COMMUNITY)
Admission: RE | Admit: 2015-05-11 | Discharge: 2015-05-11 | Disposition: A | Discharge status: Home / Self Care | Payer: 59 | Source: Ambulatory Visit | Attending: Cardiovascular Disease | Admitting: Cardiovascular Disease

## 2015-05-11 DIAGNOSIS — I359 Nonrheumatic aortic valve disorder, unspecified: Secondary | ICD-10-CM | POA: Insufficient documentation

## 2015-05-12 ENCOUNTER — Telehealth: Payer: Self-pay | Admitting: *Deleted

## 2015-05-12 NOTE — Telephone Encounter (Signed)
-----   Message from Laqueta Linden, MD sent at 05/11/2015  7:18 PM EDT ----- Normal pumping function. Aortic valve is structurally normal. There is mild valve leakage. Can f/u in 2 years as stated previously. Should he develop symptom progression over next 1-2 years, he can return sooner.

## 2015-05-12 NOTE — Telephone Encounter (Signed)
Notes Recorded by Lesle Chris, LPN on 09/08/1094 at 4:50 PM Left message to return call.

## 2015-05-16 NOTE — Telephone Encounter (Signed)
No answer

## 2015-05-18 ENCOUNTER — Telehealth: Payer: Self-pay | Admitting: Cardiovascular Disease

## 2015-05-18 NOTE — Telephone Encounter (Signed)
Notes Recorded by Lesle Chris, LPN on 1/61/0960 at 3:42 PM Patient notified.

## 2015-05-18 NOTE — Telephone Encounter (Signed)
Please call patient on cell phone w/ echo results. Number listed above. / tg

## 2015-05-18 NOTE — Telephone Encounter (Signed)
Notes Recorded by Lesle Chris, LPN on 1/61/0960 at 3:42 PM Patient notified.  See results note.

## 2015-07-09 ENCOUNTER — Other Ambulatory Visit (INDEPENDENT_AMBULATORY_CARE_PROVIDER_SITE_OTHER): Payer: Self-pay | Admitting: Internal Medicine

## 2015-07-18 ENCOUNTER — Ambulatory Visit (INDEPENDENT_AMBULATORY_CARE_PROVIDER_SITE_OTHER): Payer: 59 | Admitting: Internal Medicine

## 2015-08-02 ENCOUNTER — Ambulatory Visit (INDEPENDENT_AMBULATORY_CARE_PROVIDER_SITE_OTHER): Payer: Self-pay | Admitting: Internal Medicine

## 2015-08-02 ENCOUNTER — Encounter (INDEPENDENT_AMBULATORY_CARE_PROVIDER_SITE_OTHER): Payer: Self-pay | Admitting: Internal Medicine

## 2015-08-02 VITALS — BP 100/64 | HR 74 | Temp 97.9°F | Resp 18 | Ht 72.0 in | Wt 136.2 lb

## 2015-08-02 DIAGNOSIS — R1114 Bilious vomiting: Secondary | ICD-10-CM

## 2015-08-02 DIAGNOSIS — R1013 Epigastric pain: Secondary | ICD-10-CM

## 2015-08-02 DIAGNOSIS — R634 Abnormal weight loss: Secondary | ICD-10-CM

## 2015-08-02 IMAGING — CR DG SMALL BOWEL
14 series · 14 of 14 positions shown · non-contrast
Comparison: None

CLINICAL DATA: Epigastric pain, nausea, vomiting and diarrhea for 1
year

EXAM:
SMALL BOWEL SERIES
TECHNIQUE: Following ingestion of thin barium, serial small bowel images were
obtained including spot views of the terminal ileum.
FLUOROSCOPY TIME:  0 min 54 seconds

[run (1 of 11)]
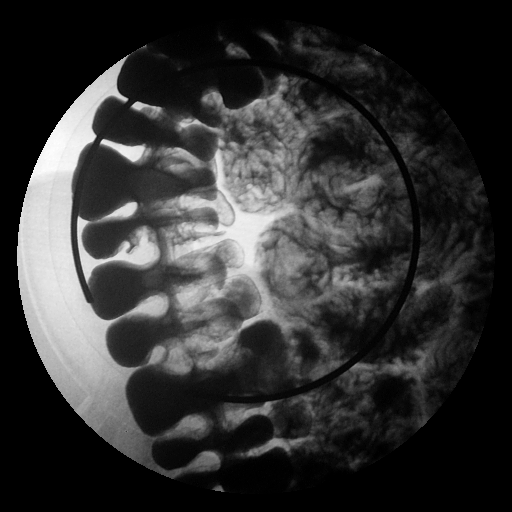

[run (2 of 11)]
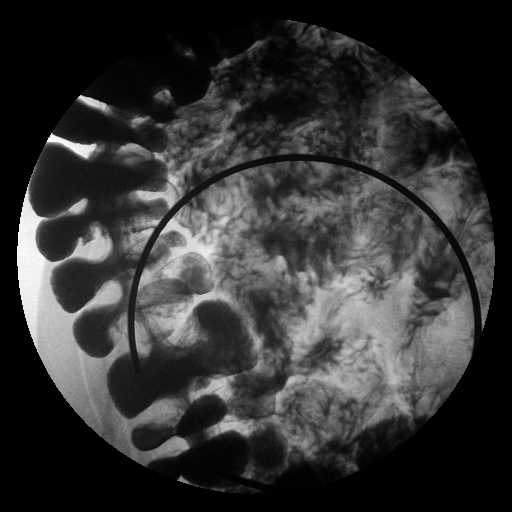

[run (3 of 11)]
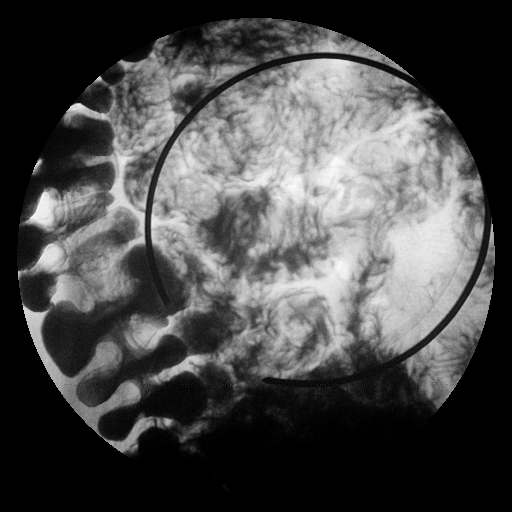

[run (4 of 11)]
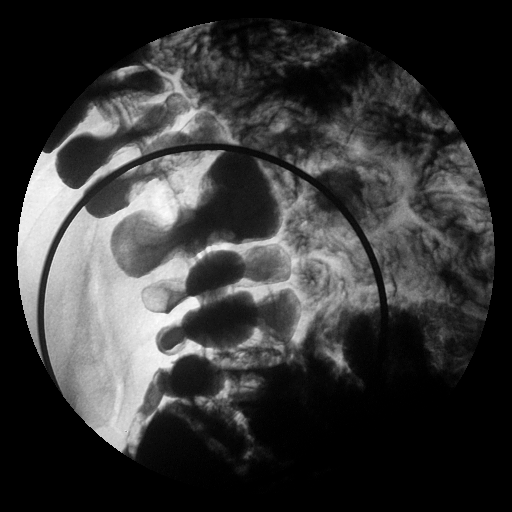

[run (5 of 11)]
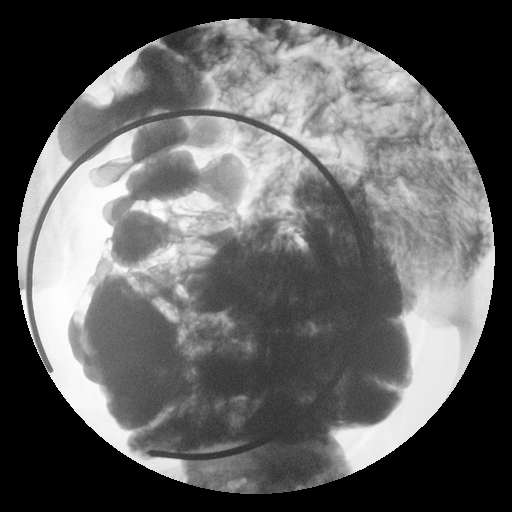

[run (6 of 11)]
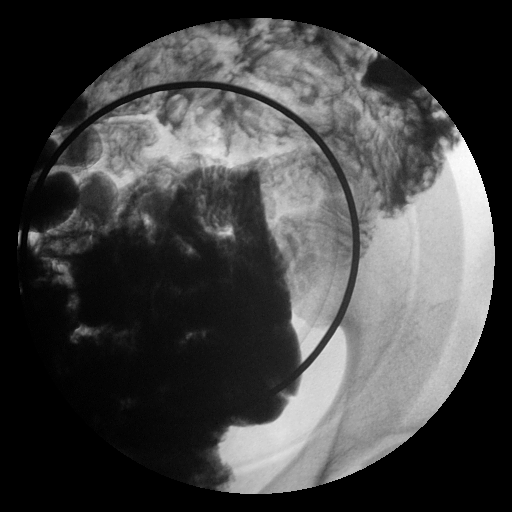

[run (7 of 11)]
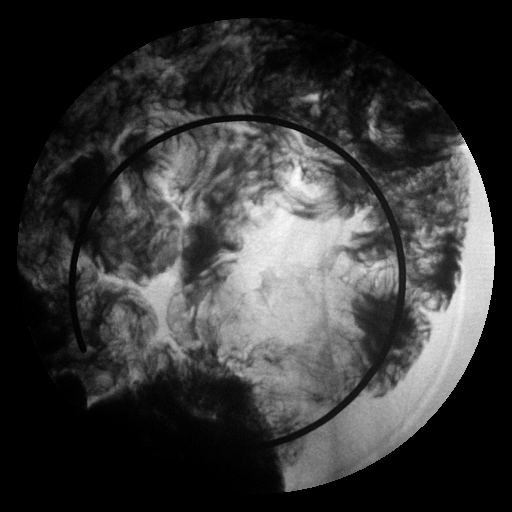

[run (8 of 11)]
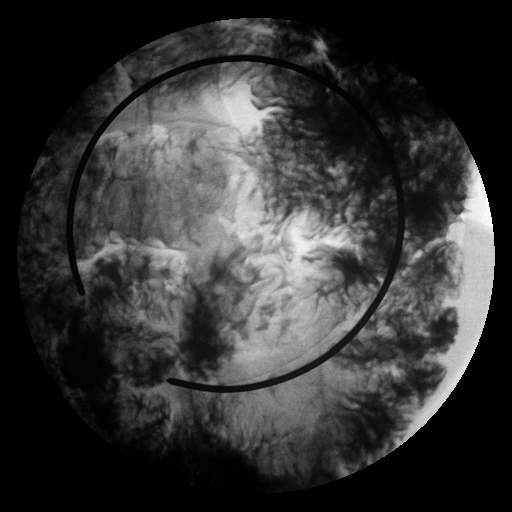

[run (9 of 11)]
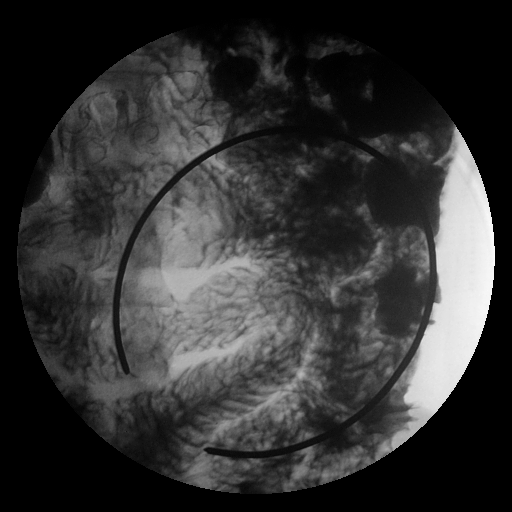

[run (10 of 11)]
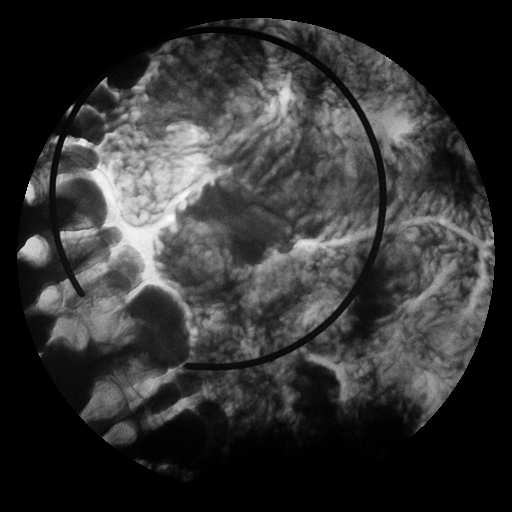

[run (11 of 11)]
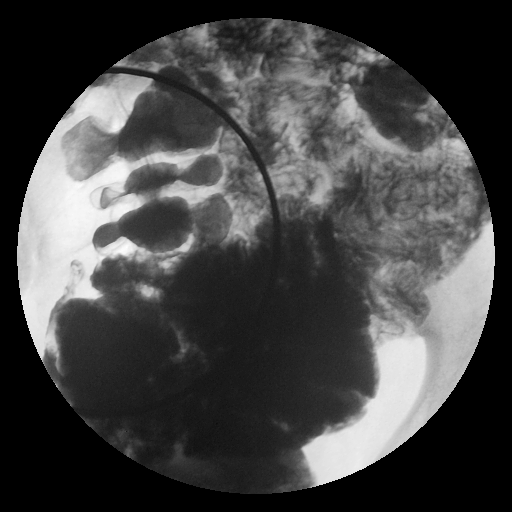

[view not recorded (1 of 3)]
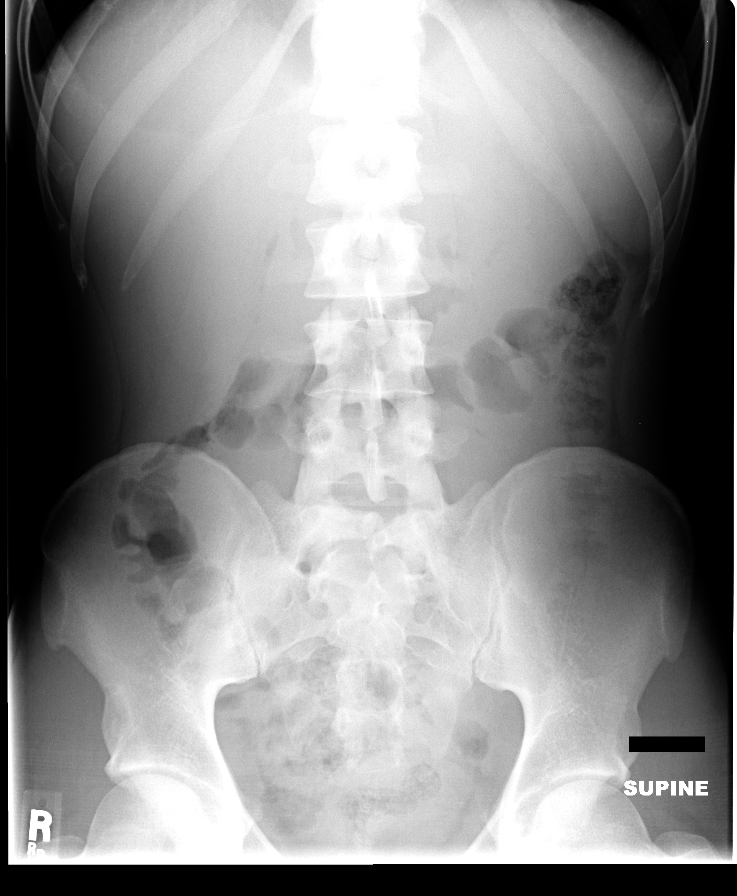

[view not recorded (2 of 3)]
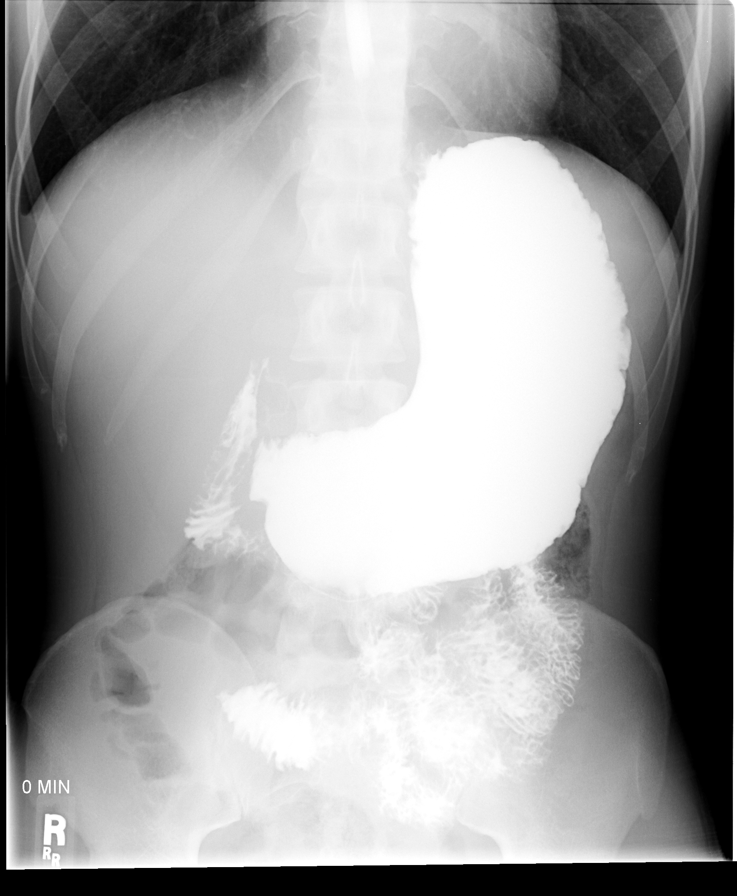

[view not recorded (3 of 3)]
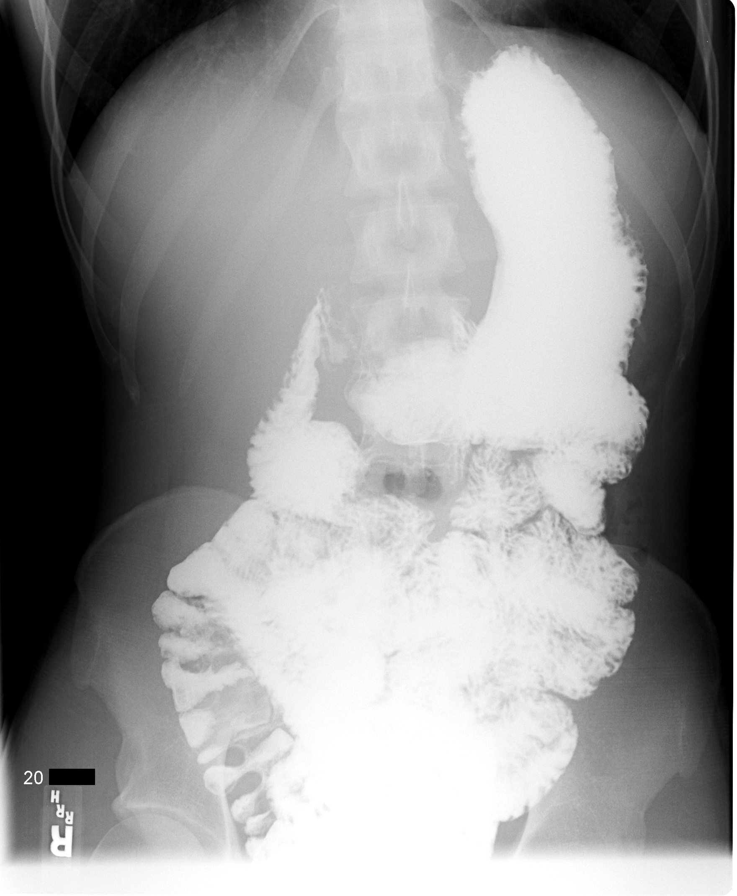

[14 of 14 positions shown; findings below may reference images not displayed]

FINDINGS: Normal bowel gas pattern on scout image.

Osseous structures unremarkable.

No pathologic calcifications.

Rapid transit of contrast from stomach through small bowel to colon
by 30 min.

No bowel dilatation, stricture, or obstruction.

Mucosal folds of the jejunum are normal in appearance without fold
or wall thickening.

Increased number of folds identified within the ileum versus normal.

No ileal fold or wall thickening identified.

Appendix visualized.

No persistent intraluminal filling defects identified within small
bowel loops.

Visualized colon unremarkable.
IMPRESSION: Increased number of mucosal folds within the ileum versus normal.

While the number of folds within the jejunum does not appear
decreased, findings raise a question of jejunization.

This could potentially be seen with celiac disease, less likely
amyloidosis.

## 2015-08-02 MED ORDER — DICYCLOMINE HCL 20 MG PO TABS: 20.0000 mg | ORAL_TABLET | Freq: Two times a day (BID) | ORAL | Status: DC

## 2015-08-02 MED ORDER — IMIPRAMINE HCL 25 MG PO TABS: 25.0000 mg | ORAL_TABLET | Freq: Every day | ORAL | Status: DC

## 2015-08-02 MED ORDER — PROMETHAZINE HCL 25 MG PO TABS: 25.0000 mg | ORAL_TABLET | Freq: Two times a day (BID) | ORAL | Status: DC | PRN

## 2015-08-02 NOTE — Progress Notes (Signed)
Presenting complaint;  Follow-up for nausea vomiting abdominal pain and weight loss.  Subjective:  Aaron Roman is 24 year old African-American male who is here for scheduled visit. He was last seen on 03/16/2015. He has gained 5 pounds since his last visit. He feels much better. He has had very few episodes of vomiting since his last visit. When he does vomited so bilious-appearing fluid. He is also having much less nausea. He feels imipramine has helped and he is not having any side effects. He needs new prescription. He has not had abdominal pain since his last visit. His bowels move regularly and he denies melena or rectal bleeding. He works 6 days a week. He is single parent. He spends his day off of this daughter. He is smoking about half a pack of cigarettes per day. He drinks 1-2 cans of beer per week.   Current Medications: Outpatient Encounter Prescriptions as of 08/02/2015  Medication Sig  . dicyclomine (BENTYL) 20 MG tablet TAKE 1 TABLET TWICE A DAY WITH BREAKFAST AND LUNCH  . imipramine (TOFRANIL) 25 MG tablet Take 1 tablet (25 mg total) by mouth at bedtime.  Marland Kitchen. omeprazole (PRILOSEC) 40 MG capsule Take 1 capsule (40 mg total) by mouth daily.  . promethazine (PHENERGAN) 25 MG tablet TAKE 1 TABLET BY MOUTH TWO TIMES DAILY AS NEEDED FOR NAUSEA OR VOMITING  . sucralfate (CARAFATE) 1 G tablet TAKE 2 TABLETS AT BEDTIME   No facility-administered encounter medications on file as of 08/02/2015.     Objective: Blood pressure 100/64, pulse 74, temperature 97.9 F (36.6 C), temperature source Oral, resp. rate 18, height 6' (1.829 m), weight 136 lb 3.2 oz (61.78 kg). Patient is alert and in no acute distress. Conjunctiva is pink. Sclera is nonicteric Oropharyngeal mucosa is normal. No neck masses or thyromegaly noted. Cardiac exam with regular rhythm normal S1 and S2. No murmur or gallop noted. Lungs are clear to auscultation. Abdomen is flat. He has tattoo over the right side of his  abdomen. Bowel sounds are normal. On palpation abdomen is soft and nontender without organomegaly or masses. No LE edema or clubbing noted.   Assessment:  #1. Nausea and vomiting with negative workup. Symptoms possibly stress-induced. He is doing better with combination of PPI and low-dose imipramine #2. Abdominal pain most likely secondary to IBS or dyspepsia. He is doing better with dicyclomine and PPI. #3. Weight loss. Weight loss has reversed. It is reassuring to know that he has gained 12 pounds since his first visit of August 2015.   Plan:  New prescription given for dicyclomine 20 mg by mouth twice a day for one month with 5 refills. New prescription given for imipramine 25 mg by mouth daily at bedtime for one month with 5 refills. Promethazine 25 mg by mouth twice a day when necessary. Patient needs to make another effort to quit cigarette smoking. Will consider dropping PPI dose at next visit. Office visit in 6 months.

## 2015-08-02 NOTE — Patient Instructions (Signed)
Notify if symptoms relapse. Must make another effort to quit cigarette smoking.

## 2015-09-29 ENCOUNTER — Encounter (HOSPITAL_COMMUNITY): Payer: Self-pay | Admitting: *Deleted

## 2015-09-29 ENCOUNTER — Emergency Department (HOSPITAL_COMMUNITY)
Admission: EM | Admit: 2015-09-29 | Discharge: 2015-09-29 | Disposition: A | Discharge status: Home / Self Care | Payer: Self-pay | Attending: Emergency Medicine | Admitting: Emergency Medicine

## 2015-09-29 DIAGNOSIS — Z79899 Other long term (current) drug therapy: Secondary | ICD-10-CM | POA: Insufficient documentation

## 2015-09-29 DIAGNOSIS — G8929 Other chronic pain: Secondary | ICD-10-CM | POA: Insufficient documentation

## 2015-09-29 DIAGNOSIS — J029 Acute pharyngitis, unspecified: Secondary | ICD-10-CM | POA: Insufficient documentation

## 2015-09-29 DIAGNOSIS — F17210 Nicotine dependence, cigarettes, uncomplicated: Secondary | ICD-10-CM | POA: Insufficient documentation

## 2015-09-29 DIAGNOSIS — B349 Viral infection, unspecified: Secondary | ICD-10-CM | POA: Insufficient documentation

## 2015-09-29 LAB — URINALYSIS, ROUTINE W REFLEX MICROSCOPIC
Bilirubin Urine: NEGATIVE
Glucose, UA: NEGATIVE mg/dL
Hgb urine dipstick: NEGATIVE
Ketones, ur: NEGATIVE mg/dL
Leukocytes, UA: NEGATIVE
Nitrite: NEGATIVE
Protein, ur: NEGATIVE mg/dL
Specific Gravity, Urine: 1.015 (ref 1.005–1.030)
pH: 7.5 (ref 5.0–8.0)

## 2015-09-29 LAB — CBC WITH DIFFERENTIAL/PLATELET
Basophils Absolute: 0 10*3/uL (ref 0.0–0.1)
Basophils Relative: 0 %
Eosinophils Absolute: 0.1 10*3/uL (ref 0.0–0.7)
Eosinophils Relative: 2 %
HCT: 40.5 % (ref 39.0–52.0)
Hemoglobin: 13.7 g/dL (ref 13.0–17.0)
Lymphocytes Relative: 38 %
Lymphs Abs: 2.3 10*3/uL (ref 0.7–4.0)
MCH: 27.9 pg (ref 26.0–34.0)
MCHC: 33.8 g/dL (ref 30.0–36.0)
MCV: 82.5 fL (ref 78.0–100.0)
Monocytes Absolute: 0.4 10*3/uL (ref 0.1–1.0)
Monocytes Relative: 7 %
Neutro Abs: 3.1 10*3/uL (ref 1.7–7.7)
Neutrophils Relative %: 53 %
Platelets: 384 10*3/uL (ref 150–400)
RBC: 4.91 MIL/uL (ref 4.22–5.81)
RDW: 13.7 % (ref 11.5–15.5)
WBC: 6 10*3/uL (ref 4.0–10.5)

## 2015-09-29 LAB — COMPREHENSIVE METABOLIC PANEL
ALK PHOS: 75 U/L (ref 38–126)
ALT: 62 U/L (ref 17–63)
AST: 52 U/L — AB (ref 15–41)
Albumin: 4.2 g/dL (ref 3.5–5.0)
Anion gap: 10 (ref 5–15)
BUN: 8 mg/dL (ref 6–20)
CHLORIDE: 106 mmol/L (ref 101–111)
CO2: 28 mmol/L (ref 22–32)
CREATININE: 0.74 mg/dL (ref 0.61–1.24)
Calcium: 9.4 mg/dL (ref 8.9–10.3)
GFR calc Af Amer: >60 mL/min (ref >60)
Glucose, Bld: 103 mg/dL — ABNORMAL HIGH (ref 65–99)
Potassium: 3.9 mmol/L (ref 3.5–5.1)
Sodium: 144 mmol/L (ref 135–145)
Total Bilirubin: 0.5 mg/dL (ref 0.3–1.2)
Total Protein: 7.5 g/dL (ref 6.5–8.1)

## 2015-09-29 LAB — RAPID STREP SCREEN (MED CTR MEBANE ONLY): Streptococcus, Group A Screen (Direct): NEGATIVE

## 2015-09-29 LAB — MONONUCLEOSIS SCREEN: Mono Screen: NEGATIVE

## 2015-09-29 LAB — LIPASE, BLOOD: LIPASE: 27 U/L (ref 11–51)

## 2015-09-29 MED ORDER — MAGIC MOUTHWASH W/LIDOCAINE: 5.0000 mL | Freq: Three times a day (TID) | ORAL | Status: DC | PRN

## 2015-09-29 MED ORDER — SODIUM CHLORIDE 0.9 % IV BOLUS (SEPSIS)
1000.0000 mL | Freq: Once | INTRAVENOUS | Status: AC
  Administered 2015-09-29: 1000 mL via INTRAVENOUS

## 2015-09-29 MED ORDER — PROMETHAZINE HCL 25 MG PO TABS: 25.0000 mg | ORAL_TABLET | Freq: Four times a day (QID) | ORAL | Status: DC | PRN

## 2015-09-29 MED ORDER — ONDANSETRON 8 MG PO TBDP
8.0000 mg | ORAL_TABLET | Freq: Once | ORAL | Status: AC
  Administered 2015-09-29: 8 mg via ORAL
  Filled 2015-09-29: qty 1

## 2015-09-29 NOTE — Discharge Instructions (Signed)

## 2015-09-29 NOTE — ED Notes (Signed)
PA at bedside.

## 2015-09-29 NOTE — ED Notes (Signed)
Pt comes in with sore throat starting last Friday. Pt began having episodes of emesis starting yesterday and continuing into today.

## 2015-09-29 NOTE — ED Notes (Signed)
Pt given cup of water to attempt for fluid challenge.

## 2015-09-29 NOTE — ED Notes (Signed)
Pt tolerated water for fluid challenge with no problems.

## 2015-10-01 NOTE — ED Provider Notes (Signed)
CSN: [id number]     Arrival date & time 09/29/15  1027 History   First MD Initiated Contact with Patient 09/29/15 [phone]     Chief Complaint  Patient presents with  . Emesis  . Sore Throat     (Consider location/radiation/quality/duration/timing/severity/associated sxs/prior Treatment) HPI   Aaron Roman is a 25 y.o. male who presents to the Emergency Department complaining of sore throat for one week.  Pain worse with swallowing.  He began having intermittent episodes of vomiting one day prior to arrival.  He states the vomiting is not associated with food intake.  Pt has hx of cyclical vomiting.  He has been using cough drops for his throat pain without relief.  He denies abdominal pain, fever, chills, rash, cough and neck pain.   Past Medical History  Diagnosis Date  . GERD (gastroesophageal reflux disease)   . Chronic abdominal pain   . Cyclical vomiting with nausea   . Chronic diarrhea    Past Surgical History  Procedure Laterality Date  . Wisdom tooth extraction    . Esophagogastroduodenoscopy N/A 04/14/2014    Procedure: ESOPHAGOGASTRODUODENOSCOPY (EGD);  Surgeon: Malissa Hippo, MD;  [facility];  Service: Endoscopy;  Laterality: N/A;  830  . Colonoscopy    . Colonoscopy N/A 02/04/2015    Procedure: COLONOSCOPY;  Surgeon: Malissa Hippo, MD;  [facility];  Service: Endoscopy;  Laterality: N/A;  145 - moved to 7:30 - Ann notified pt  . Esophagogastroduodenoscopy N/A 02/04/2015    Procedure: ESOPHAGOGASTRODUODENOSCOPY (EGD);  Surgeon: Malissa Hippo, MD;  [facility];  Service: Endoscopy;  Laterality: N/A;   Family History  Problem Relation Age of Onset  . Heart disease Father   . Hypertension Maternal Grandmother   . Diabetes Maternal Grandmother    Social History  Substance Use Topics  . Smoking status: Current Every Day Smoker -- 0.50 packs/day for 10 years    Types: Cigarettes  . Smokeless tobacco: Never Used  . Alcohol Use: 0.0  oz/week    0 Standard drinks or equivalent per week     Comment: 1 -2 beers per week    Review of Systems  Constitutional: Negative for fever, chills, activity change and appetite change.  HENT: Positive for sore throat. Negative for congestion, ear pain, facial swelling, trouble swallowing and voice change.   Eyes: Negative for pain and visual disturbance.  Respiratory: Negative for cough and shortness of breath.   Gastrointestinal: Positive for nausea and vomiting. Negative for abdominal pain and diarrhea.  Musculoskeletal: Negative for arthralgias, neck pain and neck stiffness.  Skin: Negative for color change and rash.  Neurological: Negative for dizziness, facial asymmetry, speech difficulty, numbness and headaches.  Hematological: Negative for adenopathy.  All other systems reviewed and are negative.     Allergies  Codeine and Pollen extract  Home Medications   Prior to Admission medications   Medication Sig Start Date End Date Taking? Authorizing Provider  dicyclomine (BENTYL) 20 MG tablet Take 1 tablet (20 mg total) by mouth 2 (two) times daily before a meal. 08/02/15  Yes Malissa Hippo, MD  imipramine (TOFRANIL) 25 MG tablet Take 1 tablet (25 mg total) by mouth at bedtime. 08/02/15  Yes Malissa Hippo, MD  magic mouthwash w/lidocaine SOLN Take 5 mLs by mouth 3 (three) times daily as needed for mouth pain. Swish and spit, do not swallow 09/29/15   Sequoia Witz, PA-C  promethazine (PHENERGAN) 25 MG tablet Take  1 tablet (25 mg total) by mouth every 6 (six) hours as needed for nausea or vomiting. 09/29/15   Ranesha Val, PA-C   BP 100/67 mmHg  Pulse 72  Temp(Src) 97.8 F (36.6 C) (Oral)  Resp 16  Ht 6' (1.829 m)  Wt 60.782 kg  BMI 18.17 kg/m2  SpO2 100% Physical Exam  Constitutional: He is oriented to person, place, and time. He appears well-developed and well-nourished. No distress.  HENT:  Head: Normocephalic and atraumatic.  Right Ear: Tympanic membrane and  ear canal normal.  Left Ear: Tympanic membrane and ear canal normal.  Mouth/Throat: Uvula is midline and mucous membranes are normal. No trismus in the jaw. No uvula swelling. Posterior oropharyngeal edema and posterior oropharyngeal erythema present. No oropharyngeal exudate or tonsillar abscesses.  Neck: Normal range of motion. Neck supple.  Cardiovascular: Normal rate, regular rhythm and normal heart sounds.   Pulmonary/Chest: Effort normal and breath sounds normal.  Abdominal: Soft. He exhibits no distension and no mass. There is no splenomegaly. There is no tenderness. There is no rebound and no guarding.  Musculoskeletal: Normal range of motion.  Lymphadenopathy:    He has no cervical adenopathy.  Neurological: He is alert and oriented to person, place, and time. He exhibits normal muscle tone. Coordination normal.  Skin: Skin is warm and dry.  Nursing note and vitals reviewed.   ED Course  Procedures (including critical care time) Labs Review Labs Reviewed  COMPREHENSIVE METABOLIC PANEL - Abnormal; Notable for the following:    Glucose, Bld 103 (*)    AST 52 (*)    All other components within normal limits  RAPID STREP SCREEN (NOT AT [facility])  CULTURE, GROUP A STREP ([facility])  LIPASE, BLOOD  URINALYSIS, ROUTINE W REFLEX MICROSCOPIC (NOT AT [facility])  CBC WITH DIFFERENTIAL/PLATELET  MONONUCLEOSIS SCREEN    Imaging Review No results found. I have personally reviewed and evaluated these images and lab results as part of my medical decision-making.   EKG Interpretation None      MDM   Final diagnoses:  Pharyngitis  Viral infection    Pt is non-toxic appearing.  No vomiting since ED arrival.  Airway patent, no edema.  Uvula is midline.  No sx's of peritonsillar abscess.  Labs are reassuring, strep and mono are negative  Pt is feeling better, has drank fluids w/o difficulty. Mono spot and strep are negative.  Sx's likey viral process.  Pt stabel for d/c and agrees to  symptomatic tx with magic mouthwash and     Pauline Aus, PA-C 10/01/15 2113  Marily Memos, MD 10/01/15 [phone]

## 2015-10-02 LAB — CULTURE, GROUP A STREP (THRC)

## 2016-01-31 ENCOUNTER — Ambulatory Visit (INDEPENDENT_AMBULATORY_CARE_PROVIDER_SITE_OTHER): Payer: Self-pay | Admitting: Internal Medicine

## 2016-02-02 ENCOUNTER — Encounter (INDEPENDENT_AMBULATORY_CARE_PROVIDER_SITE_OTHER): Payer: Self-pay | Admitting: Internal Medicine

## 2016-03-10 ENCOUNTER — Emergency Department (HOSPITAL_COMMUNITY)
Admission: EM | Admit: 2016-03-10 | Discharge: 2016-03-10 | Disposition: A | Discharge status: Home / Self Care | Payer: Self-pay | Attending: Emergency Medicine | Admitting: Emergency Medicine

## 2016-03-10 ENCOUNTER — Encounter (HOSPITAL_COMMUNITY): Payer: Self-pay | Admitting: Emergency Medicine

## 2016-03-10 DIAGNOSIS — Z79899 Other long term (current) drug therapy: Secondary | ICD-10-CM | POA: Insufficient documentation

## 2016-03-10 DIAGNOSIS — K047 Periapical abscess without sinus: Secondary | ICD-10-CM | POA: Insufficient documentation

## 2016-03-10 DIAGNOSIS — F17210 Nicotine dependence, cigarettes, uncomplicated: Secondary | ICD-10-CM | POA: Insufficient documentation

## 2016-03-10 MED ORDER — HYDROCODONE-ACETAMINOPHEN 5-325 MG PO TABS
1.0000 | ORAL_TABLET | Freq: Once | ORAL | Status: AC
  Administered 2016-03-10: 1 via ORAL
  Filled 2016-03-10: qty 1

## 2016-03-10 MED ORDER — HYDROCODONE-ACETAMINOPHEN 5-325 MG PO TABS: 1.0000 | ORAL_TABLET | ORAL | Status: DC | PRN

## 2016-03-10 NOTE — ED Notes (Signed)
Patient states that has a fractured root in his upper right molars. Patient states that he has been to a dentist about his tooth and was given Penicillin and 800 mg of Ibuprofen for pain. Patient states that it is not helping his pain and needs something else.

## 2016-03-10 NOTE — ED Provider Notes (Signed)
CSN: [id number]     Arrival date & time 03/10/16  2234 History   First MD Initiated Contact with Patient 03/10/16 2323     Chief Complaint  Patient presents with  . Dental Pain     (Consider location/radiation/quality/duration/timing/severity/associated sxs/prior Treatment) The history is provided by the patient and a relative.   Aaron Roman is a 25 y.o. male presenting with persistent dental pain in his right 2nd upper molar tooth.  He has had pain in the tooth for months after having a crown [facility]  He saw a new dentist in [place] this week who diagnosed a fracture of the root of the tooth involved along with dental abscess.  He was placed on penicillin (on day 3) and prescribed ibuprofen with anticipation of having the crown removed and the roots extracted this coming week.  In the interim,  His pain is not controlled with ibuprofen and has found no alleviators for his pain.    \ Past Medical History  Diagnosis Date  . GERD (gastroesophageal reflux disease)   . Chronic abdominal pain   . Cyclical vomiting with nausea   . Chronic diarrhea    Past Surgical History  Procedure Laterality Date  . Wisdom tooth extraction    . Esophagogastroduodenoscopy N/A 04/14/2014    Procedure: ESOPHAGOGASTRODUODENOSCOPY (EGD);  Surgeon: Malissa HippoNajeeb U Rehman, MD;  [facility];  Service: Endoscopy;  Laterality: N/A;  830  . Colonoscopy    . Colonoscopy N/A 02/04/2015    Procedure: COLONOSCOPY;  Surgeon: Malissa HippoNajeeb U Rehman, MD;  [facility];  Service: Endoscopy;  Laterality: N/A;  145 - moved to 7:30 - Ann notified pt  . Esophagogastroduodenoscopy N/A 02/04/2015    Procedure: ESOPHAGOGASTRODUODENOSCOPY (EGD);  Surgeon: Malissa HippoNajeeb U Rehman, MD;  [facility];  Service: Endoscopy;  Laterality: N/A;   Family History  Problem Relation Age of Onset  . Heart disease Father   . Hypertension Maternal Grandmother   . Diabetes Maternal  Grandmother    Social History  Substance Use Topics  . Smoking status: Current Every Day Smoker -- 1.00 packs/day for 10 years    Types: Cigarettes  . Smokeless tobacco: Never Used  . Alcohol Use: No     Comment: 1 -2 beers per week    Review of Systems  Constitutional: Negative for fever.  HENT: Positive for dental problem. Negative for facial swelling and sore throat.   Respiratory: Negative for shortness of breath.   Musculoskeletal: Negative for neck pain and neck stiffness.      Allergies  Codeine and Pollen extract  Home Medications   Prior to Admission medications   Medication Sig Start Date End Date Taking? Authorizing Provider  dicyclomine (BENTYL) 20 MG tablet Take 1 tablet (20 mg total) by mouth 2 (two) times daily before a meal. 08/02/15   Malissa HippoNajeeb U Rehman, MD  HYDROcodone-acetaminophen (NORCO/VICODIN) 5-325 MG tablet Take 1 tablet by mouth every 4 (four) hours as needed. 03/10/16   Burgess AmorJulie Monroe Toure, PA-C  imipramine (TOFRANIL) 25 MG tablet Take 1 tablet (25 mg total) by mouth at bedtime. 08/02/15   Malissa HippoNajeeb U Rehman, MD  magic mouthwash w/lidocaine SOLN Take 5 mLs by mouth 3 (three) times daily as needed for mouth pain. Swish and spit, do not swallow 09/29/15   Tammy Triplett, PA-C  promethazine (PHENERGAN) 25 MG tablet Take 1 tablet (25 mg total) by mouth every 6 (six) hours as needed for nausea  or vomiting. 09/29/15   Tammy Triplett, PA-C   BP 125/72 mmHg  Pulse 84  Temp(Src) 98.7 F (37.1 C) (Oral)  Resp 18  Ht 6' (1.829 m)  Wt 60.782 kg  BMI 18.17 kg/m2  SpO2 98% Physical Exam  Constitutional: He is oriented to person, place, and time. He appears well-developed and well-nourished. No distress.  HENT:  Head: Normocephalic and atraumatic.  Right Ear: Tympanic membrane and external ear normal.  Left Ear: Tympanic membrane and external ear normal.  Mouth/Throat: Uvula is midline, oropharynx is clear and moist and mucous membranes are normal. No oral lesions. No trismus  in the jaw. Abnormal dentition. Dental abscesses present.    Eyes: Conjunctivae are normal.  Neck: Normal range of motion. Neck supple.  Cardiovascular: Normal rate and normal heart sounds.   Pulmonary/Chest: Effort normal.  Abdominal: He exhibits no distension.  Musculoskeletal: Normal range of motion.  Lymphadenopathy:    He has no cervical adenopathy.  Neurological: He is alert and oriented to person, place, and time.  Skin: Skin is warm and dry. No erythema.  Psychiatric: He has a normal mood and affect.    ED Course  Procedures (including critical care time) Labs Review Labs Reviewed - No data to display  Imaging Review No results found. I have personally reviewed and evaluated these images and lab results as part of my medical decision-making.   EKG Interpretation None      MDM   Final diagnoses:  Dental abscess   Pt with acute dental abscess, not amenable to I and D.  Hydrocodone prescribed. Continue pcn. Plan per dentistry with f/u this week.  [place] controlled substance database reviewed. No history of controlled substance use.      Burgess Amor, PA-C 03/11/16 0200  Devoria Albe, MD 03/11/16 [phone]

## 2016-03-10 NOTE — Discharge Instructions (Signed)
Dental Abscess A dental abscess is a collection of pus in or around a tooth. CAUSES This condition is caused by a bacterial infection around the root of the tooth that involves the inner part of the tooth (pulp). It may result from:  Severe tooth decay.  Trauma to the tooth that allows bacteria to enter into the pulp, such as a broken or chipped tooth.  Severe gum disease around a tooth. SYMPTOMS Symptoms of this condition include:  Severe pain in and around the infected tooth.  Swelling and redness around the infected tooth, in the mouth, or in the face.  Tenderness.  Pus drainage.  Bad breath.  Bitter taste in the mouth.  Difficulty swallowing.  Difficulty opening the mouth.  Nausea.  Vomiting.  Chills.  Swollen neck glands.  Fever. DIAGNOSIS This condition is diagnosed with examination of the infected tooth. During the exam, your dentist may tap on the infected tooth. Your dentist will also ask about your medical and dental history and may order X-rays. TREATMENT This condition is treated by eliminating the infection. This may be done with:  Antibiotic medicine.  A root canal. This may be performed to save the tooth.  Pulling (extracting) the tooth. This may also involve draining the abscess. This is done if the tooth cannot be saved. HOME CARE INSTRUCTIONS  Take medicines only as directed by your dentist.  If you were prescribed antibiotic medicine, finish all of it even if you start to feel better.  Rinse your mouth (gargle) often with salt water to relieve pain or swelling.  Do not drive or operate heavy machinery while taking pain medicine.  Do not apply heat to the outside of your mouth.  Keep all follow-up visits as directed by your dentist. This is important. SEEK MEDICAL CARE IF:  Your pain is worse and is not helped by medicine. SEEK IMMEDIATE MEDICAL CARE IF:  You have a fever or chills.  Your symptoms suddenly get worse.  You have a  very bad headache.  You have problems breathing or swallowing.  You have trouble opening your mouth.  You have swelling in your neck or around your eye.   This information is not intended to replace advice given to you by your health care provider. Make sure you discuss any questions you have with your health care provider.   Document Released: 08/20/2005 Document Revised: 01/04/2015 Document Reviewed: 08/17/2014 Elsevier Interactive Patient Education 2016 [employer]   Continue taking the antibiotic prescribed by your dentist.  You may take the hydrocodone prescribed for pain relief.  This will make you drowsy - do not drive within 4 hours of taking this medication.

## 2016-03-13 ENCOUNTER — Emergency Department (HOSPITAL_COMMUNITY)
Admission: EM | Admit: 2016-03-13 | Discharge: 2016-03-13 | Disposition: A | Discharge status: Home / Self Care | Payer: Self-pay | Attending: Emergency Medicine | Admitting: Emergency Medicine

## 2016-03-13 ENCOUNTER — Encounter (HOSPITAL_COMMUNITY): Payer: Self-pay | Admitting: Emergency Medicine

## 2016-03-13 DIAGNOSIS — F17210 Nicotine dependence, cigarettes, uncomplicated: Secondary | ICD-10-CM | POA: Insufficient documentation

## 2016-03-13 DIAGNOSIS — Z79899 Other long term (current) drug therapy: Secondary | ICD-10-CM | POA: Insufficient documentation

## 2016-03-13 DIAGNOSIS — K0889 Other specified disorders of teeth and supporting structures: Secondary | ICD-10-CM | POA: Insufficient documentation

## 2016-03-13 MED ORDER — HYDROCODONE-ACETAMINOPHEN 5-325 MG PO TABS: 2.0000 | ORAL_TABLET | ORAL | Status: DC | PRN

## 2016-03-13 MED ORDER — PENICILLIN V POTASSIUM 500 MG PO TABS: 500.0000 mg | ORAL_TABLET | Freq: Four times a day (QID) | ORAL | Status: AC

## 2016-03-13 NOTE — ED Notes (Signed)
Pt reports RT sided dental pain. States he is unable to see dentist until Friday. Pt reports unrelieved pain even with prescribed medication from last visit.

## 2016-03-13 NOTE — Discharge Instructions (Signed)

## 2016-03-13 NOTE — ED Provider Notes (Signed)
CSN: [id number]     Arrival date & time 03/13/16  1306 History   First MD Initiated Contact with Patient 03/13/16 1316     Chief Complaint  Patient presents with  . Dental Pain     (Consider location/radiation/quality/duration/timing/severity/associated sxs/prior Treatment) Patient is a 25 y.o. male presenting with tooth pain. The history is provided by the patient. No language interpreter was used.  Dental Pain Location:  Upper Upper teeth location:  2/RU 2nd molar Quality:  Aching Severity:  Severe Onset quality:  Sudden Timing:  Constant Progression:  Worsening Chronicity:  New Previous work-up:  Dental exam Relieved by:  Nothing Worsened by:  Nothing tried Ineffective treatments:  None tried Associated symptoms: no facial pain and no facial swelling   Pt is requesting pain medication.  Pt is suppose to have a crown on Friday.   Pt reports continued pain  Past Medical History  Diagnosis Date  . GERD (gastroesophageal reflux disease)   . Chronic abdominal pain   . Cyclical vomiting with nausea   . Chronic diarrhea    Past Surgical History  Procedure Laterality Date  . Wisdom tooth extraction    . Esophagogastroduodenoscopy N/A 04/14/2014    Procedure: ESOPHAGOGASTRODUODENOSCOPY (EGD);  Surgeon: Malissa HippoNajeeb U Rehman, MD;  [facility];  Service: Endoscopy;  Laterality: N/A;  830  . Colonoscopy    . Colonoscopy N/A 02/04/2015    Procedure: COLONOSCOPY;  Surgeon: Malissa HippoNajeeb U Rehman, MD;  [facility];  Service: Endoscopy;  Laterality: N/A;  145 - moved to 7:30 - Ann notified pt  . Esophagogastroduodenoscopy N/A 02/04/2015    Procedure: ESOPHAGOGASTRODUODENOSCOPY (EGD);  Surgeon: Malissa HippoNajeeb U Rehman, MD;  [facility];  Service: Endoscopy;  Laterality: N/A;   Family History  Problem Relation Age of Onset  . Heart disease Father   . Hypertension Maternal Grandmother   . Diabetes Maternal Grandmother    Social History  Substance Use Topics  . Smoking  status: Current Every Day Smoker -- 1.00 packs/day for 10 years    Types: Cigarettes  . Smokeless tobacco: Never Used  . Alcohol Use: No     Comment: 1 -2 beers per week    Review of Systems  HENT: Negative for facial swelling.   All other systems reviewed and are negative.     Allergies  Codeine and Pollen extract  Home Medications   Prior to Admission medications   Medication Sig Start Date End Date Taking? Authorizing Provider  dicyclomine (BENTYL) 20 MG tablet Take 1 tablet (20 mg total) by mouth 2 (two) times daily before a meal. 08/02/15   Malissa HippoNajeeb U Rehman, MD  HYDROcodone-acetaminophen (NORCO/VICODIN) 5-325 MG tablet Take 2 tablets by mouth every 4 (four) hours as needed. 03/13/16   Elson AreasLeslie K Reeanna Acri, PA-C  imipramine (TOFRANIL) 25 MG tablet Take 1 tablet (25 mg total) by mouth at bedtime. 08/02/15   Malissa HippoNajeeb U Rehman, MD  magic mouthwash w/lidocaine SOLN Take 5 mLs by mouth 3 (three) times daily as needed for mouth pain. Swish and spit, do not swallow 09/29/15   Tammy Triplett, PA-C  penicillin v potassium (VEETID) 500 MG tablet Take 1 tablet (500 mg total) by mouth 4 (four) times daily. 03/13/16 03/20/16  Elson AreasLeslie K Vivan Vanderveer, PA-C  promethazine (PHENERGAN) 25 MG tablet Take 1 tablet (25 mg total) by mouth every 6 (six) hours as needed for nausea or vomiting. 09/29/15   Tammy Triplett, PA-C   BP 123/84 mmHg  Pulse 78  Temp(Src)  98.2 F (36.8 C) (Oral)  Resp 16  Ht 6' (1.829 m)  Wt 60.782 kg  BMI 18.17 kg/m2  SpO2 98% Physical Exam  Constitutional: He is oriented to person, place, and time. He appears well-developed and well-nourished.  HENT:  Head: Normocephalic.  Broken tooth  Eyes: EOM are normal.  Neck: Normal range of motion.  Pulmonary/Chest: Effort normal.  Abdominal: He exhibits no distension.  Musculoskeletal: Normal range of motion.  Neurological: He is alert and oriented to person, place, and time.  Psychiatric: He has a normal mood and affect.  Nursing note and  vitals reviewed.   ED Course  Procedures (including critical care time) Labs Review Labs Reviewed - No data to display  Imaging Review No results found. I have personally reviewed and evaluated these images and lab results as part of my medical decision-making.   EKG Interpretation None      MDM no previous rx before 7/8.   Pt counseled on narcotic treatment and need to limit.   Final diagnoses:  Toothache    Meds ordered this encounter  Medications  . HYDROcodone-acetaminophen (NORCO/VICODIN) 5-325 MG tablet    Sig: Take 2 tablets by mouth every 4 (four) hours as needed.    Dispense:  10 tablet    Refill:  0    Order Specific Question:  Supervising Provider    Answer:  Hyacinth Meeker, BRIAN [[id number]]  . penicillin v potassium (VEETID) 500 MG tablet    Sig: Take 1 tablet (500 mg total) by mouth 4 (four) times daily.    Dispense:  28 tablet    Refill:  0    Order Specific Question:  Supervising Provider    Answer:  Eber Hong [[id number]]      Lonia Skinner McGregor, PA-C 03/13/16 1440  Lonia Skinner [place], [place] 03/13/16 1441  Lavera Guise, MD 03/14/16 1521

## 2016-07-03 ENCOUNTER — Encounter (HOSPITAL_COMMUNITY): Payer: Self-pay | Admitting: Emergency Medicine

## 2016-07-03 ENCOUNTER — Ambulatory Visit (HOSPITAL_COMMUNITY)
Admission: RE | Admit: 2016-07-03 | Discharge: 2016-07-03 | Disposition: A | Discharge status: Home / Self Care | Payer: Self-pay | Attending: Psychiatry | Admitting: Psychiatry

## 2016-07-03 DIAGNOSIS — R450 Nervousness: Secondary | ICD-10-CM | POA: Insufficient documentation

## 2016-07-03 DIAGNOSIS — F1120 Opioid dependence, uncomplicated: Secondary | ICD-10-CM | POA: Insufficient documentation

## 2016-07-03 NOTE — H&P (Signed)
Behavioral Health Medical Screening Exam  Aaron Roman is an 25 y.o. male who [facility] to seek help with abuse of opioid pain pills. Denies medical history. States "I have been borrowing money from family to buy all kinds of opiates. I need to get it under control." Denies any acute concerns such as chest pain, shortness of breath, visual changes, or skin rash. Patient will be provided with outpatient referrals by the counselor [facility].   Total Time spent with patient: 20 minutes  Psychiatric Specialty Exam: Physical Exam  Constitutional: He is oriented to person, place, and time. He appears well-developed and well-nourished.  HENT:  Head: Normocephalic and atraumatic.  Right Ear: External ear normal.  Left Ear: External ear normal.  Neck: Normal range of motion. Neck supple.  Cardiovascular: Normal rate, regular rhythm, normal heart sounds and intact distal pulses.   Respiratory: Effort normal and breath sounds normal.  GI: Soft. Bowel sounds are normal.  Musculoskeletal: Normal range of motion.  Neurological: He is alert and oriented to person, place, and time.  Skin: Skin is warm and dry.    Review of Systems  Psychiatric/Behavioral: Positive for substance abuse. The patient is nervous/anxious.     Blood pressure 121/82, pulse 94, temperature 98.2 F (36.8 C), resp. rate 18.There is no height or weight on file to calculate BMI.  General Appearance: Disheveled  Eye Contact:  Good  Speech:  Clear and Coherent  Volume:  Normal  Mood:  Anxious  Affect:  Appropriate  Thought Process:  Coherent and Goal Directed  Orientation:  Full (Time, Place, and Person)  Thought Content:  Desire to stop using opioid pain pills   Suicidal Thoughts:  No  Homicidal Thoughts:  No  Memory:  Immediate;   Good Recent;   Good Remote;   Good  Judgement:  Poor  Insight:  Present  Psychomotor Activity:  Normal  Concentration: Concentration: Good and Attention Span: Good   Recall:  Good  Fund of Knowledge:Good  Language: Good  Akathisia:  No  Handed:  Right  AIMS (if indicated):     Assets:  Communication Skills Desire for Improvement Intimacy Leisure Time Physical Health Resilience Social Support  Sleep:       Musculoskeletal: Strength & Muscle Tone: within normal limits Gait & Station: normal Patient leans: N/A  Blood pressure 121/82, pulse 94, temperature 98.2 F (36.8 C), resp. rate 18.  Recommendations:  Based on my evaluation the patient does not appear to have an emergency medical condition.  Fransisca KaufmannAVIS, Adarryl Goldammer, NP 07/03/2016, 2:30 PM

## 2016-07-03 NOTE — BH Assessment (Signed)
Tele Assessment Note  Pt presents voluntarily to [facility] for evaluation accompanied by half sister Ludger Nutting. Pt is pleasant and oriented x 4. His affect is blunted and reports depressed mood. He reports he has been abusing opioids (he snorts them). Pt reports he uses whatever pain pills he can get including percocet, hydrocodone and oxycodone. Pt starts he began using opioids heavily at age 25. He reports he smokes THC once a month. Pt sts he last used opioids at 2 am today (three 7.5 hydrocodone). He endorses fatigue, insomnia, tearful, isolating, and guilt. Current stressors are his drug use and his 73 yo daughter's mother not letting him see daughter. Pt sts he saw a therapist 1.5 yrs ago at [facility] when  Pt denies SI currently or at any time in the past. Pt denies any history of suicide attempts, or of self-mutilation. Pt reports guilt for grandmother helping him by giving him $ so he will avoid withdrawals when grandmother needs to fix her flooring. He reports short term memory impairment. Pt sts he has only made it one day clean and sober since starting to use pain pills heavily at age 28. Pt denies homicidal thoughts or physical aggression. Pt denies having access to firearms. Pt denies having any legal problems at this time. Pt is calm and cooperative during assessment. Pt denies hallucinations. Pt does not appear to be responding to internal stimuli and exhibits no delusional thought. Pt's reality testing appears to be intact. Sister reports she has GAD.    Aaron Roman is an 25 y.o. male.   Diagnosis: Opioid Use Disorder, Severe Substance Induced Mood Disorder  Past Medical History:  Past Medical History:  Diagnosis Date  . Chronic abdominal pain   . Chronic diarrhea   . Cyclical vomiting with nausea   . GERD (gastroesophageal reflux disease)     Past Surgical History:  Procedure Laterality Date  . COLONOSCOPY    . COLONOSCOPY N/A 02/04/2015   Procedure: COLONOSCOPY;  Surgeon:  Malissa Hippo, MD;  [facility];  Service: Endoscopy;  Laterality: N/A;  145 - moved to 7:30 - Ann notified pt  . ESOPHAGOGASTRODUODENOSCOPY N/A 04/14/2014   Procedure: ESOPHAGOGASTRODUODENOSCOPY (EGD);  Surgeon: Malissa Hippo, MD;  [facility];  Service: Endoscopy;  Laterality: N/A;  830  . ESOPHAGOGASTRODUODENOSCOPY N/A 02/04/2015   Procedure: ESOPHAGOGASTRODUODENOSCOPY (EGD);  Surgeon: Malissa Hippo, MD;  Location[facility];  Service: Endoscopy;  Laterality: N/A;  . WISDOM TOOTH EXTRACTION      Family History:  Family History  Problem Relation Age of Onset  . Heart disease Father   . Hypertension Maternal Grandmother   . Diabetes Maternal Grandmother     Social History:  reports that he has been smoking Cigarettes.  He has a 10.00 pack-year smoking history. He has never used smokeless tobacco. He reports that he uses drugs, including Oxycodone, Hydrocodone, and Marijuana. He reports that he does not drink alcohol.  Additional Social History:  Alcohol / Drug Use Pain Medications: pt reports abusing various types of pain pills Prescriptions: pt denies abuse  Over the Counter: pt denies abuse History of alcohol / drug use?: Yes Longest period of sobriety (when/how long): one day since has was 25 yo Substance #1 Name of Substance 1: opioids - percocet, hydrocodone, oxycodone (pt snorts it) 1 - Age of First Use: 15 1 - Amount (size/oz): varies 1 - Frequency: as often as he can 1 - Duration: pt began using heavily  at age 25 1 - Last Use / Amount: 07/03/16 - three 7.5 hydrocodone Substance #2 Name of Substance 2: marijuana 2 - Age of First Use: 14 2 - Frequency: once every month 2 - Last Use / Amount: 06/26/16  CIWA: CIWA-Ar BP: 121/82 Pulse Rate: 94 COWS:    PATIENT STRENGTHS: (choose at least two) Capable of independent living Communication skills General fund of knowledge Motivation for treatment/growth Physical Health  Allergies:   Allergies  Allergen Reactions  . Codeine Nausea And Vomiting  . Pollen Extract Itching    Home Medications:  (Not in a hospital admission)  OB/GYN Status:  No LMP for male patient.  General Assessment Data [facility] Assessment: In system Is this a Tele or Face-to-Face Assessment?: Face-to-Face Is this an Initial Assessment or a Re-assessment for this encounter?: Initial Assessment Marital status: Single Maiden name: none Is patient pregnant?: No Pregnancy Status: No Living Arrangements: Parent (mom) Can pt return to current living arrangement?: Yes Admission Status: Voluntary Is patient capable of signing voluntary admission?: Yes Referral Source: Self/Family/Friend Insurance type: self pay  Medical Screening Exam [facility] Walk[facility]) Medical Exam completed: Yes  Crisis Care Plan Living Arrangements: Parent (mom) Name of Psychiatrist: none Name of Therapist: none  Education Status Is patient currently in school?: No Highest grade of school patient has completed: 814 Name of school: [employer]  Risk to self with the past 6 months Has patient been a risk to self within the past 6 months prior to admission? : No Suicidal Intent: No Has patient had any suicidal intent within the past 6 months prior to admission? : No Is patient at risk for suicide?: No Suicidal Plan?: No Has patient had any suicidal plan within the past 6 months prior to admission? : No Access to Means: No What has been your use of drugs/alcohol within the last 12 months?: daily opioid use, thc once a month Previous Attempts/Gestures: No How many times?: 0 Other Self Harm Risks: none Triggers for Past Attempts:  (n/a) Intentional Self Injurious Behavior: None Family Suicide History: No Recent stressful life event(s): Other (Comment) (drug addiction, can't see his 704 yo daughter) Persecutory voices/beliefs?: No Depression: Yes Depression Symptoms:  Fatigue, Guilt, Isolating, Tearfulness, Insomnia, Loss of interest in usual pleasures Substance abuse history and/or treatment for substance abuse?: Yes Suicide prevention information given to non-admitted patients: Not applicable  Risk to Others within the past 6 months Homicidal Ideation: No Does patient have any lifetime risk of violence toward others beyond the six months prior to admission? : No Thoughts of Harm to Others: No Current Homicidal Intent: No Current Homicidal Plan: No Access to Homicidal Means: No Identified Victim: none History of harm to others?: No Assessment of Violence: None Noted Violent Behavior Description: pt denies hx violence Does patient have access to weapons?: No Criminal Charges Pending?: Yes Describe Pending Criminal Charges: reckless driving - wanton disregard Does patient have a court date: Yes Court Date: 08/16/16 Is patient on probation?: No  Psychosis Hallucinations: None noted Delusions: None noted  Mental Status Report Appearance/Hygiene: Unremarkable (sweatshirt has cig burn hole) Eye Contact: Good Motor Activity: Freedom of movement, Unremarkable Speech: Logical/coherent, Soft Level of Consciousness: Alert, Quiet/awake Mood: Depressed, Sad, Anhedonia, Guilty Affect: Appropriate to circumstance, Blunted Anxiety Level: Minimal Thought Processes: Relevant, Coherent Judgement: Unimpaired Orientation: Person, Place, Time, Situation Obsessive Compulsive Thoughts/Behaviors: None  Cognitive Functioning Concentration: Normal Memory: Recent Impaired, Remote Intact IQ: Average Insight: Fair Impulse Control: Poor Appetite:  Poor Sleep: Decreased Total Hours of Sleep: 4 Vegetative Symptoms: None  ADLScreening [facility]) Patient's cognitive ability adequate to safely complete daily activities?: Yes Patient able to express need for assistance with ADLs?: Yes Independently performs ADLs?: Yes (appropriate for developmental  age)  Prior Inpatient Therapy Prior Inpatient Therapy: No  Prior Outpatient Therapy Prior Outpatient Therapy: Yes Prior Therapy Dates: 1.5 yrs ago Prior Therapy Facilty/Provider(s): [facility]  Reason for Treatment: depressed b/c fighting conflict w/ baby mom Does patient have an ACCT team?: No Does patient have Intensive In-House Services?  : No Does patient have [facility] services? : No Does patient have P4CC services?: No  ADL Screening (condition at time of admission) Patient's cognitive ability adequate to safely complete daily activities?: Yes Is the patient deaf or have difficulty hearing?: No Does the patient have difficulty seeing, even when wearing glasses/contacts?: No Does the patient have difficulty concentrating, remembering, or making decisions?: Yes Patient able to express need for assistance with ADLs?: Yes Does the patient have difficulty dressing or bathing?: No Independently performs ADLs?: Yes (appropriate for developmental age) Does the patient have difficulty walking or climbing stairs?: No Weakness of Legs: None Weakness of Arms/Hands: None  Home Assistive Devices/Equipment Home Assistive Devices/Equipment: None    Abuse/Neglect Assessment (Assessment to be complete while patient is alone) Physical Abuse: Denies Verbal Abuse: Denies Sexual Abuse: Denies Exploitation of patient/patient's resources: Denies Self-Neglect: Denies     Merchant navy officerAdvance Directives (For Healthcare) Does patient have an advance directive?: No Would patient like information on creating an advanced directive?: No - patient declined information    Additional Information 1:1 In Past 12 Months?: No CIRT Risk: No Elopement Risk: No Does patient have medical clearance?: No     Disposition:  Disposition Initial Assessment Completed for this Encounter: Yes Disposition of Patient: Referred to Patient referred to: [facility], [facility], [facility]   Fransisca KaufmannLaura Davis NP recommends either inpatient  or outpatient SA treatment for pt. Writer called [facility] but Trula OreChristina reports they don't have beds. Writer [facility] but they have a two week waiting list and he isn't taking any referrals for initial screenings. [facility] but [facility] wouldn't be able to go there either. Writer gave pt info for [facility] opioid clinic. Pt [facility]  Aleksey Newbern P 07/03/2016 4:04 PM

## 2016-11-09 ENCOUNTER — Encounter (HOSPITAL_COMMUNITY): Payer: Self-pay

## 2016-11-09 ENCOUNTER — Emergency Department (HOSPITAL_COMMUNITY)
Admission: EM | Admit: 2016-11-09 | Discharge: 2016-11-09 | Disposition: A | Discharge status: Home / Self Care | Payer: Self-pay | Attending: Emergency Medicine | Admitting: Emergency Medicine

## 2016-11-09 ENCOUNTER — Emergency Department (HOSPITAL_COMMUNITY): Payer: Self-pay

## 2016-11-09 DIAGNOSIS — J020 Streptococcal pharyngitis: Secondary | ICD-10-CM | POA: Insufficient documentation

## 2016-11-09 DIAGNOSIS — R197 Diarrhea, unspecified: Secondary | ICD-10-CM | POA: Insufficient documentation

## 2016-11-09 DIAGNOSIS — R112 Nausea with vomiting, unspecified: Secondary | ICD-10-CM | POA: Insufficient documentation

## 2016-11-09 DIAGNOSIS — F17210 Nicotine dependence, cigarettes, uncomplicated: Secondary | ICD-10-CM | POA: Insufficient documentation

## 2016-11-09 LAB — RAPID STREP SCREEN (MED CTR MEBANE ONLY): STREPTOCOCCUS, GROUP A SCREEN (DIRECT): POSITIVE — AB

## 2016-11-09 MED ORDER — ONDANSETRON 4 MG PO TBDP: 4.0000 mg | ORAL_TABLET | Freq: Three times a day (TID) | ORAL | 0 refills | Status: DC | PRN

## 2016-11-09 MED ORDER — AMOXICILLIN 500 MG PO CAPS: 500.0000 mg | ORAL_CAPSULE | Freq: Two times a day (BID) | ORAL | 0 refills | Status: AC

## 2016-11-09 MED ORDER — AMOXICILLIN 250 MG PO CAPS
500.0000 mg | ORAL_CAPSULE | Freq: Once | ORAL | Status: AC
  Administered 2016-11-09: 500 mg via ORAL
  Filled 2016-11-09: qty 2

## 2016-11-09 MED ORDER — ONDANSETRON 8 MG PO TBDP
8.0000 mg | ORAL_TABLET | Freq: Once | ORAL | Status: AC
  Administered 2016-11-09: 8 mg via ORAL
  Filled 2016-11-09: qty 1

## 2016-11-09 NOTE — ED Provider Notes (Signed)
[facility]-EM[facility] Provider Note   CSN: [id number] Arrival date & time: 11/09/16  1147     History   Chief Complaint Chief Complaint  Patient presents with  . Sore Throat  . Emesis    HPI Aaron Roman is a 26 y.o. male.  HPI  Pt was seen at 1210.  Per pt, c/o gradual onset and persistence of constant sore throat for the past 2-3 days. Has been associated with cough and several intermittent episodes of N/V/D that began last night. Denies fevers, no rash, no CP/SOB, no abd pain.    Past Medical History:  Diagnosis Date  . Chronic abdominal pain   . Chronic diarrhea   . Cyclical vomiting with nausea   . GERD (gastroesophageal reflux disease)     Patient Active Problem List   Diagnosis Date Noted  . Chest pain 03/02/2015  . Abdominal pain, epigastric 04/09/2014  . Nausea and vomiting 04/09/2014    Past Surgical History:  Procedure Laterality Date  . COLONOSCOPY    . COLONOSCOPY N/A 02/04/2015   Procedure: COLONOSCOPY;  Surgeon: Malissa HippoNajeeb U Rehman, MD;  [facility];  Service: Endoscopy;  Laterality: N/A;  145 - moved to 7:30 - Ann notified pt  . ESOPHAGOGASTRODUODENOSCOPY N/A 04/14/2014   Procedure: ESOPHAGOGASTRODUODENOSCOPY (EGD);  Surgeon: Malissa HippoNajeeb U Rehman, MD;  Location[facility];  Service: Endoscopy;  Laterality: N/A;  830  . ESOPHAGOGASTRODUODENOSCOPY N/A 02/04/2015   Procedure: ESOPHAGOGASTRODUODENOSCOPY (EGD);  Surgeon: Malissa HippoNajeeb U Rehman, MD;  Location: [facility];  Service: Endoscopy;  Laterality: N/A;  . WISDOM TOOTH EXTRACTION         Home Medications    Prior to Admission medications   Medication Sig Start Date End Date Taking? Authorizing Provider  dicyclomine (BENTYL) 20 MG tablet Take 1 tablet (20 mg total) by mouth 2 (two) times daily before a meal. 08/02/15   Malissa HippoNajeeb U Rehman, MD  HYDROcodone-acetaminophen (NORCO/VICODIN) 5-325 MG tablet Take 2 tablets by mouth every 4 (four) hours as needed. 03/13/16   Elson AreasLeslie K Sofia, PA-C  imipramine  (TOFRANIL) 25 MG tablet Take 1 tablet (25 mg total) by mouth at bedtime. 08/02/15   Malissa HippoNajeeb U Rehman, MD  magic mouthwash w/lidocaine SOLN Take 5 mLs by mouth 3 (three) times daily as needed for mouth pain. Swish and spit, do not swallow 09/29/15   Tammy Triplett, PA-C  promethazine (PHENERGAN) 25 MG tablet Take 1 tablet (25 mg total) by mouth every 6 (six) hours as needed for nausea or vomiting. 09/29/15   Pauline Ausammy Triplett, PA-C    Family History Family History  Problem Relation Age of Onset  . Heart disease Father   . Hypertension Maternal Grandmother   . Diabetes Maternal Grandmother     Social History Social History  Substance Use Topics  . Smoking status: Current Every Day Smoker    Packs/day: 1.00    Years: 10.00    Types: Cigarettes  . Smokeless tobacco: Never Used  . Alcohol use No     Comment: 1 -2 beers per week     Allergies   Codeine and Pollen extract   Review of Systems Review of Systems ROS: Statement: All systems negative except as marked or noted in the HPI; Constitutional: Negative for fever and chills. ; ; Eyes: Negative for eye pain, redness and discharge. ; ; ENMT: Negative for ear pain, hoarseness, nasal congestion, sinus pressure and +sore throat. ; ; Cardiovascular: Negative for chest pain, palpitations, diaphoresis, dyspnea and peripheral edema. ; ;  Respiratory: +cough. Negative for wheezing and stridor. ; ; Gastrointestinal: +N/V/D. Negative for abdominal pain, blood in stool, hematemesis, jaundice and rectal bleeding. . ; ; Genitourinary: Negative for dysuria, flank pain and hematuria. ; ; Musculoskeletal: Negative for back pain and neck pain. Negative for swelling and trauma.; ; Skin: Negative for pruritus, rash, abrasions, blisters, bruising and skin lesion.; ; Neuro: Negative for headache, lightheadedness and neck stiffness. Negative for weakness, altered level of consciousness, altered mental status, extremity weakness, paresthesias, involuntary movement,  seizure and syncope.       Physical Exam Updated Vital Signs BP 136/86   Pulse 85   Temp 98.4 F (36.9 C) (Oral)   Resp 17   Ht 6' (1.829 m)   Wt 135 lb (61.2 kg)   SpO2 100%   BMI 18.31 kg/m   Physical Exam 1215: Physical examination:  Nursing notes reviewed; Vital signs and O2 SAT reviewed;  Constitutional: Well developed, Well nourished, Well hydrated, In no acute distress; Head:  Normocephalic, atraumatic; Eyes: EOMI, PERRL, No scleral icterus; ENMT: TM's clear bilat. +edemetous nasal turbinates bilat with clear rhinorrhea. Mouth and pharynx without lesions. No tonsillar exudates. No intra-oral edema. No submandibular or sublingual edema. No hoarse voice, no drooling, no stridor. No pain with manipulation of larynx. No trismus.  Mouth and pharynx normal, Mucous membranes moist; Neck: Supple, Full range of motion, No lymphadenopathy; Cardiovascular: Regular rate and rhythm, No gallop; Respiratory: Breath sounds clear & equal bilaterally, No wheezes.  Speaking full sentences with ease, Normal respiratory effort/excursion; Chest: Nontender, Movement normal; Abdomen: Soft, Nontender, Nondistended, Normal bowel sounds; Genitourinary: No CVA tenderness; Extremities: Pulses normal, No tenderness, No edema, No calf edema or asymmetry.; Neuro: AA&Ox3, Major CN grossly intact.  Speech clear. No gross focal motor or sensory deficits in extremities.; Skin: Color normal, Warm, Dry.   ED Treatments / Results  Labs (all labs ordered are listed, but only abnormal results are displayed)   EKG  EKG Interpretation None       Radiology   Procedures Procedures (including critical care time)  Medications Ordered in ED Medications  ondansetron (ZOFRAN-ODT) disintegrating tablet 8 mg (not administered)     Initial Impression / Assessment and Plan / ED Course  I have reviewed the triage vital signs and the nursing notes.  Pertinent labs & imaging results that were available during my  care of the patient were reviewed by me and considered in my medical decision making (see chart for details).  MDM Reviewed: previous chart, nursing note and vitals Interpretation: labs and x-ray    Results for orders placed or performed during the hospital encounter of 11/09/16  Rapid strep screen  Result Value Ref Range   Streptococcus, Group A Screen (Direct) POSITIVE (A) NEGATIVE   Dg Abd Acute W/chest Result Date: 11/09/2016 CLINICAL DATA:  26 year old male with a history of sore throat and nausea EXAM: DG ABDOMEN ACUTE W/ 1V CHEST COMPARISON:  None. FINDINGS: Chest: Cardiomediastinal silhouette within normal limits. No evidence of pneumothorax, pleural effusion, or confluent airspace disease. Abdomen: Gas within stomach, small bowel, colon, without abnormal distention. No unexpected radiopaque foreign body. No unexpected soft tissue density. No unexpected calcifications. No displaced fracture. IMPRESSION: Chest: No radiographic evidence of acute cardiopulmonary disease. Abdomen: Normal bowel gas pattern. Electronically Signed   By: Gilmer Mor D.O.   On: 11/09/2016 13:53    1415:  Pt has tol PO well while in the ED without N/V.  No stooling while in the ED.  Abd remains  benign, VSS. Feels better and wants to go home now. Tx for strep throat. Dx and testing d/w pt.  Questions answered.  Verb understanding, agreeable to d/c home with outpt f/u.     Final Clinical Impressions(s) / ED Diagnoses   Final diagnoses:  None    New Prescriptions New Prescriptions   No medications on file     Samuel Jester, DO 11/14/16 1610

## 2016-11-09 NOTE — ED Triage Notes (Signed)
Sore throat, nausea, vomiting and diarrhea x3 days. No fevers.

## 2016-11-09 NOTE — Discharge Instructions (Signed)
Take the prescriptions as directed.  Increase your fluid intake (ie:  Gatoraide) for the next few days.  Eat a bland diet and advance to your regular diet slowly as you can tolerate it.   Avoid full strength juices, as well as milk and milk products until your diarrhea has resolved.   Call your regular medical doctor today to schedule a follow up appointment in the next 2 days.  Return to the Emergency Department immediately sooner if worsening.

## 2016-11-09 NOTE — ED Notes (Signed)
Pt made aware to return if symptoms worsen or if any life threatening symptoms occur.   

## 2016-12-26 ENCOUNTER — Encounter (HOSPITAL_COMMUNITY): Payer: Self-pay | Admitting: Cardiology

## 2016-12-26 ENCOUNTER — Emergency Department (HOSPITAL_COMMUNITY)
Admission: EM | Admit: 2016-12-26 | Discharge: 2016-12-26 | Disposition: A | Discharge status: Home / Self Care | Payer: Self-pay | Attending: Emergency Medicine | Admitting: Emergency Medicine

## 2016-12-26 DIAGNOSIS — W57XXXA Bitten or stung by nonvenomous insect and other nonvenomous arthropods, initial encounter: Secondary | ICD-10-CM | POA: Insufficient documentation

## 2016-12-26 DIAGNOSIS — Z79899 Other long term (current) drug therapy: Secondary | ICD-10-CM | POA: Insufficient documentation

## 2016-12-26 DIAGNOSIS — Y939 Activity, unspecified: Secondary | ICD-10-CM | POA: Insufficient documentation

## 2016-12-26 DIAGNOSIS — S70262A Insect bite (nonvenomous), left hip, initial encounter: Secondary | ICD-10-CM | POA: Insufficient documentation

## 2016-12-26 DIAGNOSIS — Y929 Unspecified place or not applicable: Secondary | ICD-10-CM | POA: Insufficient documentation

## 2016-12-26 DIAGNOSIS — F1290 Cannabis use, unspecified, uncomplicated: Secondary | ICD-10-CM | POA: Insufficient documentation

## 2016-12-26 DIAGNOSIS — Y999 Unspecified external cause status: Secondary | ICD-10-CM | POA: Insufficient documentation

## 2016-12-26 DIAGNOSIS — F17210 Nicotine dependence, cigarettes, uncomplicated: Secondary | ICD-10-CM | POA: Insufficient documentation

## 2016-12-26 MED ORDER — DOXYCYCLINE HYCLATE 100 MG PO CAPS: 100.0000 mg | ORAL_CAPSULE | Freq: Two times a day (BID) | ORAL | 0 refills | Status: DC

## 2016-12-26 MED ORDER — IBUPROFEN 600 MG PO TABS: 600.0000 mg | ORAL_TABLET | Freq: Four times a day (QID) | ORAL | 0 refills | Status: DC | PRN

## 2016-12-26 NOTE — ED Triage Notes (Signed)
Removed tick from left hip 2 days ago.  Now has 2 boils to that area.

## 2016-12-26 NOTE — ED Provider Notes (Signed)
[facility] Provider Note   CSN: [id number] Arrival date & time: 12/26/16  1109     History   Chief Complaint Chief Complaint  Patient presents with  . Tick Removal    HPI Aaron Roman is a 26 y.o. male.  HPI   26 year old male presenting requesting for tick bite. Patient reports 5 days ago he was chasing someone through the woods and the next day he discovered to take embedded on his skin near his left hip. He was able to successfully remove the ticks.  He is here due to increasing sharp pain to affected areas with associate redness.  Pain is moderate, non radiating.  No associate fever, headache or joint pain.  No specific treatment tried.    Past Medical History:  Diagnosis Date  . Chronic abdominal pain   . Chronic diarrhea   . Cyclical vomiting with nausea   . GERD (gastroesophageal reflux disease)     Patient Active Problem List   Diagnosis Date Noted  . Chest pain 03/02/2015  . Abdominal pain, epigastric 04/09/2014  . Nausea and vomiting 04/09/2014    Past Surgical History:  Procedure Laterality Date  . COLONOSCOPY    . COLONOSCOPY N/A 02/04/2015   Procedure: COLONOSCOPY;  Surgeon: Malissa Hippo, MD;  Location: [facility];  Service: Endoscopy;  Laterality: N/A;  145 - moved to 7:30 - Ann notified pt  . ESOPHAGOGASTRODUODENOSCOPY N/A 04/14/2014   Procedure: ESOPHAGOGASTRODUODENOSCOPY (EGD);  Surgeon: Malissa Hippo, MD;  Location: [facility];  Service: Endoscopy;  Laterality: N/A;  830  . ESOPHAGOGASTRODUODENOSCOPY N/A 02/04/2015   Procedure: ESOPHAGOGASTRODUODENOSCOPY (EGD);  Surgeon: Malissa Hippo, MD;  Location: [facility];  Service: Endoscopy;  Laterality: N/A;  . WISDOM TOOTH EXTRACTION         Home Medications    Prior to Admission medications   Medication Sig Start Date End Date Taking? Authorizing Provider  dicyclomine (BENTYL) 20 MG tablet Take 1 tablet (20 mg total) by mouth 2 (two) times daily before a meal. Patient not  taking: Reported on 11/09/2016 08/02/15   Malissa Hippo, MD  HYDROcodone-acetaminophen (NORCO/VICODIN) 5-325 MG tablet Take 2 tablets by mouth every 4 (four) hours as needed. Patient not taking: Reported on 11/09/2016 03/13/16   Elson Areas, PA-C  imipramine (TOFRANIL) 25 MG tablet Take 1 tablet (25 mg total) by mouth at bedtime. Patient not taking: Reported on 11/09/2016 08/02/15   Malissa Hippo, MD  magic mouthwash w/lidocaine SOLN Take 5 mLs by mouth 3 (three) times daily as needed for mouth pain. Swish and spit, do not swallow Patient not taking: Reported on 11/09/2016 09/29/15   Tammy Triplett, PA-C  ondansetron (ZOFRAN ODT) 4 MG disintegrating tablet Take 1 tablet (4 mg total) by mouth every 8 (eight) hours as needed for nausea or vomiting. 11/09/16   Samuel Jester, DO  promethazine (PHENERGAN) 25 MG tablet Take 1 tablet (25 mg total) by mouth every 6 (six) hours as needed for nausea or vomiting. Patient not taking: Reported on 11/09/2016 09/29/15   Pauline Aus, PA-C    Family History Family History  Problem Relation Age of Onset  . Heart disease Father   . Hypertension Maternal Grandmother   . Diabetes Maternal Grandmother     Social History Social History  Substance Use Topics  . Smoking status: Current Every Day Smoker    Packs/day: 1.00    Years: 10.00    Types: Cigarettes  . Smokeless tobacco: Never Used  .  Alcohol use No     Comment: 1 -2 beers per week     Allergies   Codeine and Pollen extract   Review of Systems Review of Systems  Constitutional: Negative for fever.  Musculoskeletal: Positive for arthralgias.  Neurological: Negative for headaches.     Physical Exam Updated Vital Signs BP 120/72   Pulse 84   Temp 98.6 F (37 C) (Oral)   Resp 16   Ht 6' (1.829 m)   Wt 61.2 kg   SpO2 97%   BMI 18.31 kg/m   Physical Exam  Constitutional: He appears well-developed and well-nourished. No distress.  HENT:  Head: Atraumatic.  Eyes: Conjunctivae are  normal.  Neck: Neck supple.  Cardiovascular: Normal rate and regular rhythm.   Pulmonary/Chest: Effort normal and breath sounds normal.  Abdominal: Soft. He exhibits no distension. There is no tenderness.  Neurological: He is alert.  Skin: No rash noted.  L hip: 2 localize skin irritation measuring approximately 1-2 cm in diameter with overlying scab noted to lateral aspect of hip. TTS, no induration or fluctuance, no central clearing.  No abscess.  No retained tick body.   Psychiatric: He has a normal mood and affect.  Nursing note and vitals reviewed.    ED Treatments / Results  Labs (all labs ordered are listed, but only abnormal results are displayed) Labs Reviewed - No data to display  EKG  EKG Interpretation None       Radiology No results found.  Procedures Procedures (including critical care time)  Medications Ordered in ED Medications - No data to display   Initial Impression / Assessment and Plan / ED Course  I have reviewed the triage vital signs and the nursing notes.  Pertinent labs & imaging results that were available during my care of the patient were reviewed by me and considered in my medical decision making (see chart for details).     BP 120/72   Pulse 84   Temp 98.6 F (37 C) (Oral)   Resp 16   Ht 6' (1.829 m)   Wt 61.2 kg   SpO2 97%   BMI 18.31 kg/m    Final Clinical Impressions(s) / ED Diagnoses   Final diagnoses:  Tick bite, initial encounter    New Prescriptions New Prescriptions   DOXYCYCLINE (VIBRAMYCIN) 100 MG CAPSULE    Take 1 capsule (100 mg total) by mouth 2 (two) times daily. One po bid x 7 days   IBUPROFEN (ADVIL,MOTRIN) 600 MG TABLET    Take 1 tablet (600 mg total) by mouth every 6 (six) hours as needed.   11:40 AM Pt here with recent tick bite to the left hip region.  Tick has been removed.  Localized skin irritation with potential developing cellulitis. No abscess.  Will treat with doxy, ibuprofen and return  precaution given.     Fayrene Helper, PA-C 12/26/16 1144    Vanetta Mulders, MD 12/27/16 [phone]

## 2017-02-05 ENCOUNTER — Encounter (HOSPITAL_COMMUNITY): Payer: Self-pay | Admitting: Emergency Medicine

## 2017-02-05 ENCOUNTER — Emergency Department (HOSPITAL_COMMUNITY)
Admission: EM | Admit: 2017-02-05 | Discharge: 2017-02-05 | Disposition: A | Discharge status: Home Health | Payer: Self-pay | Attending: Emergency Medicine | Admitting: Emergency Medicine

## 2017-02-05 DIAGNOSIS — K029 Dental caries, unspecified: Secondary | ICD-10-CM | POA: Insufficient documentation

## 2017-02-05 DIAGNOSIS — F17210 Nicotine dependence, cigarettes, uncomplicated: Secondary | ICD-10-CM | POA: Insufficient documentation

## 2017-02-05 MED ORDER — IBUPROFEN 800 MG PO TABS
800.0000 mg | ORAL_TABLET | Freq: Once | ORAL | Status: AC
  Administered 2017-02-05: 800 mg via ORAL
  Filled 2017-02-05: qty 1

## 2017-02-05 MED ORDER — TRAMADOL HCL 50 MG PO TABS
ORAL_TABLET | ORAL | Status: AC
  Filled 2017-02-05: qty 1

## 2017-02-05 MED ORDER — CLINDAMYCIN HCL 150 MG PO CAPS
300.0000 mg | ORAL_CAPSULE | Freq: Once | ORAL | Status: AC
  Administered 2017-02-05: 300 mg via ORAL
  Filled 2017-02-05: qty 2

## 2017-02-05 MED ORDER — IBUPROFEN 600 MG PO TABS: 600.0000 mg | ORAL_TABLET | Freq: Four times a day (QID) | ORAL | 0 refills | Status: DC

## 2017-02-05 MED ORDER — TRAMADOL HCL 50 MG PO TABS: ORAL_TABLET | ORAL | 0 refills | Status: DC

## 2017-02-05 MED ORDER — ONDANSETRON HCL 4 MG PO TABS
4.0000 mg | ORAL_TABLET | Freq: Once | ORAL | Status: AC
  Administered 2017-02-05: 4 mg via ORAL
  Filled 2017-02-05: qty 1

## 2017-02-05 MED ORDER — TRAMADOL HCL 50 MG PO TABS
100.0000 mg | ORAL_TABLET | Freq: Once | ORAL | Status: AC
  Administered 2017-02-05: 100 mg via ORAL
  Filled 2017-02-05: qty 2

## 2017-02-05 MED ORDER — CLINDAMYCIN HCL 150 MG PO CAPS: 150.0000 mg | ORAL_CAPSULE | Freq: Four times a day (QID) | ORAL | 0 refills | Status: DC

## 2017-02-05 NOTE — Discharge Instructions (Signed)
Please use saltwater swishes to your gum. Use clindamycin, and ibuprofen with breakfast, lunch, dinner, and bedtime. Use ultram for more severe pain. See your dentist as soon as possible.

## 2017-02-05 NOTE — ED Triage Notes (Signed)
Patient complaining of dental pain x 3-4 days.

## 2017-02-05 NOTE — ED Provider Notes (Signed)
[facility] Provider Note   CSN: [id number] Arrival date & time: 02/05/17  1208     History   Chief Complaint Chief Complaint  Patient presents with  . Dental Pain    HPI Aaron Roman is a 26 y.o. male.  The history is provided by the patient.  Dental Pain   This is a new problem. The current episode started more than 2 days ago. The problem occurs daily. The problem has been gradually worsening. The pain is moderate. He has tried acetaminophen (ibuprofen) for the symptoms. The treatment provided no relief.    Past Medical History:  Diagnosis Date  . Chronic abdominal pain   . Chronic diarrhea   . Cyclical vomiting with nausea   . GERD (gastroesophageal reflux disease)     Patient Active Problem List   Diagnosis Date Noted  . Chest pain 03/02/2015  . Abdominal pain, epigastric 04/09/2014  . Nausea and vomiting 04/09/2014    Past Surgical History:  Procedure Laterality Date  . COLONOSCOPY    . COLONOSCOPY N/A 02/04/2015   Procedure: COLONOSCOPY;  Surgeon: Malissa Hippo, MD;  [facility]: [facility];  Service: Endoscopy;  Laterality: N/A;  145 - moved to 7:30 - Ann notified pt  . ESOPHAGOGASTRODUODENOSCOPY N/A 04/14/2014   Procedure: ESOPHAGOGASTRODUODENOSCOPY (EGD);  Surgeon: Malissa Hippo, MD;  [facility]: [facility];  Service: Endoscopy;  Laterality: N/A;  830  . ESOPHAGOGASTRODUODENOSCOPY N/A 02/04/2015   Procedure: ESOPHAGOGASTRODUODENOSCOPY (EGD);  Surgeon: Malissa Hippo, MD;  Location: [facility];  Service: Endoscopy;  Laterality: N/A;  . WISDOM TOOTH EXTRACTION         Home Medications    Prior to Admission medications   Medication Sig Start Date End Date Taking? Authorizing Provider  clindamycin (CLEOCIN) 150 MG capsule Take 1 capsule (150 mg total) by mouth every 6 (six) hours. 02/05/17   Ivery Quale, PA-C  dicyclomine (BENTYL) 20 MG tablet Take 1 tablet (20 mg total) by mouth 2 (two) times daily before a meal. Patient not taking:  Reported on 11/09/2016 08/02/15   Malissa Hippo, MD  doxycycline (VIBRAMYCIN) 100 MG capsule Take 1 capsule (100 mg total) by mouth 2 (two) times daily. One po bid x 7 days 12/26/16   Fayrene Helper, PA-C  HYDROcodone-acetaminophen (NORCO/VICODIN) 5-325 MG tablet Take 2 tablets by mouth every 4 (four) hours as needed. Patient not taking: Reported on 11/09/2016 03/13/16   Elson Areas, PA-C  ibuprofen (ADVIL,MOTRIN) 600 MG tablet Take 1 tablet (600 mg total) by mouth 4 (four) times daily. 02/05/17   Ivery Quale, PA-C  imipramine (TOFRANIL) 25 MG tablet Take 1 tablet (25 mg total) by mouth at bedtime. Patient not taking: Reported on 11/09/2016 08/02/15   Malissa Hippo, MD  magic mouthwash w/lidocaine SOLN Take 5 mLs by mouth 3 (three) times daily as needed for mouth pain. Swish and spit, do not swallow Patient not taking: Reported on 11/09/2016 09/29/15   Triplett, Tammy, PA-C  ondansetron (ZOFRAN ODT) 4 MG disintegrating tablet Take 1 tablet (4 mg total) by mouth every 8 (eight) hours as needed for nausea or vomiting. 11/09/16   Samuel Jester, DO  promethazine (PHENERGAN) 25 MG tablet Take 1 tablet (25 mg total) by mouth every 6 (six) hours as needed for nausea or vomiting. Patient not taking: Reported on 11/09/2016 09/29/15   Pauline Aus, PA-C  traMADol Janean Sark) 50 MG tablet 1 or 2 po q6h prn pain 02/05/17   Ivery Quale, PA-C  Family History Family History  Problem Relation Age of Onset  . Heart disease Father   . Hypertension Maternal Grandmother   . Diabetes Maternal Grandmother     Social History Social History  Substance Use Topics  . Smoking status: Current Every Day Smoker    Packs/day: 1.00    Years: 10.00    Types: Cigarettes  . Smokeless tobacco: Never Used  . Alcohol use No     Comment: 1 -2 beers per week     Allergies   Codeine and Pollen extract   Review of Systems Review of Systems  Constitutional: Negative for activity change and appetite change.  HENT:  Positive for dental problem. Negative for congestion, ear discharge, ear pain, facial swelling, nosebleeds, rhinorrhea, sneezing and tinnitus.   Eyes: Negative for photophobia, pain and discharge.  Respiratory: Negative for cough, choking, shortness of breath and wheezing.   Cardiovascular: Negative for chest pain, palpitations and leg swelling.  Gastrointestinal: Negative for abdominal pain, blood in stool, constipation, diarrhea, nausea and vomiting.  Genitourinary: Negative for difficulty urinating, dysuria, flank pain, frequency and hematuria.  Musculoskeletal: Negative for back pain, gait problem, myalgias and neck pain.  Skin: Negative for color change, rash and wound.  Neurological: Negative for dizziness, seizures, syncope, facial asymmetry, speech difficulty, weakness and numbness.  Hematological: Negative for adenopathy. Does not bruise/bleed easily.  Psychiatric/Behavioral: Negative for agitation, confusion, hallucinations, self-injury and suicidal ideas. The patient is not nervous/anxious.      Physical Exam Updated Vital Signs BP 113/75 (BP Location: Right Arm)   Pulse 83   Temp 97.8 F (36.6 C) (Oral)   Resp 16   Ht 5\' 9"  (1.753 m)   Wt 61.2 kg (135 lb)   SpO2 100%   BMI 19.94 kg/m   Physical Exam  Constitutional: Vital signs are normal. He appears well-developed and well-nourished. He is active.  HENT:  Head: Normocephalic and atraumatic.  Right Ear: Tympanic membrane, external ear and ear canal normal.  Left Ear: Tympanic membrane, external ear and ear canal normal.  Nose: Nose normal.  Mouth/Throat: Uvula is midline, oropharynx is clear and moist and mucous membranes are normal.  There is a cavity and some gum swelling of the right upper molar area. The airway is patent. Is no swelling under the tongue.  Eyes: Conjunctivae, EOM and lids are normal. Pupils are equal, round, and reactive to light.  Neck: Trachea normal, normal range of motion and phonation normal.  Neck supple. Carotid bruit is not present.  Cardiovascular: Normal rate, regular rhythm and normal pulses.   Murmur heard.  Systolic murmur is present with a grade of 2/6  Abdominal: Soft. Normal appearance and bowel sounds are normal.  Lymphadenopathy:       Head (right side): No submental, no preauricular and no posterior auricular adenopathy present.       Head (left side): No submental, no preauricular and no posterior auricular adenopathy present.    He has cervical adenopathy.  Neurological: He is alert. He has normal strength. No cranial nerve deficit or sensory deficit. GCS eye subscore is 4. GCS verbal subscore is 5. GCS motor subscore is 6.  Skin: Skin is warm and dry.  Psychiatric: His speech is normal.     ED Treatments / Results  Labs (all labs ordered are listed, but only abnormal results are displayed) Labs Reviewed - No data to display  EKG  EKG Interpretation None       Radiology No results found.  Procedures  Procedures (including critical care time)  Medications Ordered in ED Medications  clindamycin (CLEOCIN) capsule 300 mg (not administered)  ondansetron (ZOFRAN) tablet 4 mg (not administered)  ibuprofen (ADVIL,MOTRIN) tablet 800 mg (not administered)  traMADol (ULTRAM) tablet 100 mg (not administered)     Initial Impression / Assessment and Plan / ED Course  I have reviewed the triage vital signs and the nursing notes.  Pertinent labs & imaging results that were available during my care of the patient were reviewed by me and considered in my medical decision making (see chart for details).       Final Clinical Impressions(s) / ED Diagnoses MDM Vital signs within normal limits. Patient has swelling of the gum and a cavity involving the right molar area. Is no evidence of Ludwig's angina. The patient will be treated with clindamycin, ibuprofen, and Ultram. I strongly encouraged the patient to see a dentist as soon as possible. The patient  acknowledges understanding of the instructions.    Final diagnoses:  Dental caries    New Prescriptions New Prescriptions   CLINDAMYCIN (CLEOCIN) 150 MG CAPSULE    Take 1 capsule (150 mg total) by mouth every 6 (six) hours.   IBUPROFEN (ADVIL,MOTRIN) 600 MG TABLET    Take 1 tablet (600 mg total) by mouth 4 (four) times daily.   TRAMADOL (ULTRAM) 50 MG TABLET    1 or 2 po q6h prn pain     Ivery QualeBryant, Keni Elison, PA-C 02/06/17 2346    Bethann BerkshireZammit, Joseph, MD 02/12/17 1622

## 2017-02-07 ENCOUNTER — Emergency Department (HOSPITAL_COMMUNITY)
Admission: EM | Admit: 2017-02-07 | Discharge: 2017-02-07 | Disposition: A | Discharge status: Home / Self Care | Payer: Self-pay | Attending: Emergency Medicine | Admitting: Emergency Medicine

## 2017-02-07 ENCOUNTER — Encounter (HOSPITAL_COMMUNITY): Payer: Self-pay | Admitting: Emergency Medicine

## 2017-02-07 DIAGNOSIS — K0889 Other specified disorders of teeth and supporting structures: Secondary | ICD-10-CM | POA: Insufficient documentation

## 2017-02-07 DIAGNOSIS — F17210 Nicotine dependence, cigarettes, uncomplicated: Secondary | ICD-10-CM | POA: Insufficient documentation

## 2017-02-07 MED ORDER — DICLOFENAC SODIUM 75 MG PO TBEC: 75.0000 mg | DELAYED_RELEASE_TABLET | Freq: Two times a day (BID) | ORAL | 0 refills | Status: DC

## 2017-02-07 NOTE — ED Provider Notes (Signed)
[facility] Provider Note   CSN: [id number] Arrival date & time: 02/07/17  0907     History   Chief Complaint Chief Complaint  Patient presents with  . Dental Pain    HPI Montray N Markuson is a 26 y.o. male.  HPI   Khyrie N Solum is a 26 y.o. male who presents to the Emergency Department complaining of dental pain for one week.  He was seen here two days ago for same and returns today with complaints of continued dental pain.  States the ibuprofen and tramadol are not controlling his pain.  He denies facial swelling, fever, difficulty swallowing or neck pain. Has appt with dentist next week.    Past Medical History:  Diagnosis Date  . Chronic abdominal pain   . Chronic diarrhea   . Cyclical vomiting with nausea   . GERD (gastroesophageal reflux disease)     Patient Active Problem List   Diagnosis Date Noted  . Chest pain 03/02/2015  . Abdominal pain, epigastric 04/09/2014  . Nausea and vomiting 04/09/2014    Past Surgical History:  Procedure Laterality Date  . COLONOSCOPY    . COLONOSCOPY N/A 02/04/2015   Procedure: COLONOSCOPY;  Surgeon: Malissa Hippo, MD;  [facility];  Service: Endoscopy;  Laterality: N/A;  145 - moved to 7:30 - Ann notified pt  . ESOPHAGOGASTRODUODENOSCOPY N/A 04/14/2014   Procedure: ESOPHAGOGASTRODUODENOSCOPY (EGD);  Surgeon: Malissa Hippo, MD;  [facility];  Service: Endoscopy;  Laterality: N/A;  830  . ESOPHAGOGASTRODUODENOSCOPY N/A 02/04/2015   Procedure: ESOPHAGOGASTRODUODENOSCOPY (EGD);  Surgeon: Malissa Hippo, MD;  [facility]: [facility];  Service: Endoscopy;  Laterality: N/A;  . WISDOM TOOTH EXTRACTION         Home Medications    Prior to Admission medications   Medication Sig Start Date End Date Taking? Authorizing Provider  clindamycin (CLEOCIN) 150 MG capsule Take 1 capsule (150 mg total) by mouth every 6 (six) hours. 02/05/17   Ivery Quale, PA-C  dicyclomine (BENTYL) 20 MG tablet Take 1 tablet (20  mg total) by mouth 2 (two) times daily before a meal. Patient not taking: Reported on 11/09/2016 08/02/15   Malissa Hippo, MD  doxycycline (VIBRAMYCIN) 100 MG capsule Take 1 capsule (100 mg total) by mouth 2 (two) times daily. One po bid x 7 days 12/26/16   Fayrene Helper, PA-C  HYDROcodone-acetaminophen (NORCO/VICODIN) 5-325 MG tablet Take 2 tablets by mouth every 4 (four) hours as needed. Patient not taking: Reported on 11/09/2016 03/13/16   Elson Areas, PA-C  ibuprofen (ADVIL,MOTRIN) 600 MG tablet Take 1 tablet (600 mg total) by mouth 4 (four) times daily. 02/05/17   Ivery Quale, PA-C  imipramine (TOFRANIL) 25 MG tablet Take 1 tablet (25 mg total) by mouth at bedtime. Patient not taking: Reported on 11/09/2016 08/02/15   Malissa Hippo, MD  magic mouthwash w/lidocaine SOLN Take 5 mLs by mouth 3 (three) times daily as needed for mouth pain. Swish and spit, do not swallow Patient not taking: Reported on 11/09/2016 09/29/15   Verneice Caspers, PA-C  ondansetron (ZOFRAN ODT) 4 MG disintegrating tablet Take 1 tablet (4 mg total) by mouth every 8 (eight) hours as needed for nausea or vomiting. 11/09/16   Samuel Jester, DO  promethazine (PHENERGAN) 25 MG tablet Take 1 tablet (25 mg total) by mouth every 6 (six) hours as needed for nausea or vomiting. Patient not taking: Reported on 11/09/2016 09/29/15   Pauline Aus, PA-C  traMADol (  ULTRAM) 50 MG tablet 1 or 2 po q6h prn pain 02/05/17   Ivery Quale, PA-C    Family History Family History  Problem Relation Age of Onset  . Heart disease Father   . Hypertension Maternal Grandmother   . Diabetes Maternal Grandmother     Social History Social History  Substance Use Topics  . Smoking status: Current Every Day Smoker    Packs/day: 1.00    Years: 10.00    Types: Cigarettes  . Smokeless tobacco: Never Used  . Alcohol use No     Comment: 1 -2 beers per week     Allergies   Codeine and Pollen extract   Review of Systems Review of Systems    Constitutional: Negative for appetite change and fever.  HENT: Positive for dental problem. Negative for congestion, facial swelling, sore throat and trouble swallowing.   Eyes: Negative for pain and visual disturbance.  Musculoskeletal: Negative for neck pain and neck stiffness.  Neurological: Negative for dizziness, facial asymmetry and headaches.  Hematological: Negative for adenopathy.  All other systems reviewed and are negative.    Physical Exam Updated Vital Signs BP 112/86 (BP Location: Right Arm)   Pulse (!) 59   Temp 97.9 F (36.6 C) (Oral)   Resp 16   Ht 5\' 9"  (1.753 m)   Wt 61.2 kg (135 lb)   SpO2 99%   BMI 19.94 kg/m   Physical Exam  Constitutional: He is oriented to person, place, and time. He appears well-developed and well-nourished. No distress.  HENT:  Head: Normocephalic and atraumatic.  Right Ear: Tympanic membrane and ear canal normal.  Left Ear: Tympanic membrane and ear canal normal.  Mouth/Throat: Uvula is midline, oropharynx is clear and moist and mucous membranes are normal. No trismus in the jaw. Dental caries present. No dental abscesses or uvula swelling.  ttp of the right upper molars.  Dental decay of the second upper molar. No facial swelling, obvious dental abscess, trismus, or sublingual abnml.    Neck: Normal range of motion. Neck supple.  Cardiovascular: Normal rate, regular rhythm and normal heart sounds.   No murmur heard. Pulmonary/Chest: Effort normal and breath sounds normal.  Musculoskeletal: Normal range of motion.  Lymphadenopathy:    He has no cervical adenopathy.  Neurological: He is alert and oriented to person, place, and time. He exhibits normal muscle tone. Coordination normal.  Skin: Skin is warm and dry.  Nursing note and vitals reviewed.    ED Treatments / Results  Labs (all labs ordered are listed, but only abnormal results are displayed) Labs Reviewed - No data to display  EKG  EKG Interpretation None        Radiology No results found.  Procedures Procedures (including critical care time)  Medications Ordered in ED Medications - No data to display   Initial Impression / Assessment and Plan / ED Course  I have reviewed the triage vital signs and the nursing notes.  Pertinent labs & imaging results that were available during my care of the patient were reviewed by me and considered in my medical decision making (see chart for details).     Pt seen here two days ago for same.  Taking tramadol, ibuprofen and clinda prescribed from previous visit. Pt requesting pain medication.  I do not feel that narcotics are indicated for this, pt is well appearing, vitals stable.  No concerning sx's for dental abscess or Ludwig's angina.  pt has appt with dentist next week.  I  have advised him to continue abx, d/c ibuprofen and tramadol and prescribed Diclofenac.    Final Clinical Impressions(s) / ED Diagnoses   Final diagnoses:  Pain, dental    New Prescriptions New Prescriptions   No medications on file     Pauline Ausriplett, Ruberta Holck, Cordelia Poche-C 02/07/17 [id number]    Eber HongMiller, Brian, MD 02/07/17 1515

## 2017-02-07 NOTE — ED Triage Notes (Signed)
Patient complaining of right sided dental pain x 1 week. Patient was seen here two days ago for same.

## 2017-02-07 NOTE — Discharge Instructions (Signed)
As discussed, stop the ibuprofen and the tramadol.  Continue taking the antibiotic as directed.  Follow-up with your dentist.  You may also try Oragel as directed.

## 2017-04-23 ENCOUNTER — Emergency Department (HOSPITAL_COMMUNITY)
Admission: EM | Admit: 2017-04-23 | Discharge: 2017-04-23 | Disposition: A | Discharge status: Home / Self Care | Payer: Self-pay | Attending: Emergency Medicine | Admitting: Emergency Medicine

## 2017-04-23 ENCOUNTER — Encounter (HOSPITAL_COMMUNITY): Payer: Self-pay | Admitting: *Deleted

## 2017-04-23 DIAGNOSIS — F17210 Nicotine dependence, cigarettes, uncomplicated: Secondary | ICD-10-CM | POA: Insufficient documentation

## 2017-04-23 DIAGNOSIS — K029 Dental caries, unspecified: Secondary | ICD-10-CM | POA: Insufficient documentation

## 2017-04-23 DIAGNOSIS — R591 Generalized enlarged lymph nodes: Secondary | ICD-10-CM | POA: Insufficient documentation

## 2017-04-23 MED ORDER — HYDROCODONE-ACETAMINOPHEN 5-325 MG PO TABS
1.0000 | ORAL_TABLET | Freq: Once | ORAL | Status: AC
  Administered 2017-04-23: 1 via ORAL
  Filled 2017-04-23: qty 1

## 2017-04-23 NOTE — ED Triage Notes (Signed)
Pt c/o pain to a wisdom tooth on the right upper part of his mouth, right ear pain, facial swelling, and sore throat.

## 2017-04-24 NOTE — ED Provider Notes (Signed)
[facility] Provider Note   CSN: [id number] Arrival date & time: 04/23/17  2134     History   Chief Complaint Chief Complaint  Patient presents with  . Dental Pain    HPI Aaron Roman is a 26 y.o. male.  The history is provided by the patient.  Dental Pain   This is a chronic problem. The current episode started more than 1 week ago. The problem occurs daily. The problem has been gradually worsening. The pain is moderate.  patient presents with ongoing dental pain This has been present for awhile He reports pain in right upper molar He reports facial swelling He also reports ear pain but no hearing loss He reports mild HA No visual changes  He also reports enlarged lymph node on right neck for several months He has been on multiple antibiotics, most recently last week he as was on amoxicillin given to him by Lanier Prude   Past Medical History:  Diagnosis Date  . Chronic abdominal pain   . Chronic diarrhea   . Cyclical vomiting with nausea   . GERD (gastroesophageal reflux disease)     Patient Active Problem List   Diagnosis Date Noted  . Chest pain 03/02/2015  . Abdominal pain, epigastric 04/09/2014  . Nausea and vomiting 04/09/2014    Past Surgical History:  Procedure Laterality Date  . COLONOSCOPY    . COLONOSCOPY N/A 02/04/2015   Procedure: COLONOSCOPY;  Surgeon: Malissa Hippo, MD;  Location: [facility];  Service: Endoscopy;  Laterality: N/A;  145 - moved to 7:30 - Ann notified pt  . ESOPHAGOGASTRODUODENOSCOPY N/A 04/14/2014   Procedure: ESOPHAGOGASTRODUODENOSCOPY (EGD);  Surgeon: Malissa Hippo, MD;  Location: [facility];  Service: Endoscopy;  Laterality: N/A;  830  . ESOPHAGOGASTRODUODENOSCOPY N/A 02/04/2015   Procedure: ESOPHAGOGASTRODUODENOSCOPY (EGD);  Surgeon: Malissa Hippo, MD;  Location: [facility];  Service: Endoscopy;  Laterality: N/A;  . WISDOM TOOTH EXTRACTION         Home Medications    Prior to Admission  medications   Medication Sig Start Date End Date Taking? Authorizing Provider  imipramine (TOFRANIL) 25 MG tablet Take 1 tablet (25 mg total) by mouth at bedtime. Patient not taking: Reported on 11/09/2016 08/02/15   Malissa Hippo, MD  magic mouthwash w/lidocaine SOLN Take 5 mLs by mouth 3 (three) times daily as needed for mouth pain. Swish and spit, do not swallow Patient not taking: Reported on 11/09/2016 09/29/15   Triplett, Tammy, PA-C  ondansetron (ZOFRAN ODT) 4 MG disintegrating tablet Take 1 tablet (4 mg total) by mouth every 8 (eight) hours as needed for nausea or vomiting. 11/09/16   Samuel Jester, DO    Family History Family History  Problem Relation Age of Onset  . Heart disease Father   . Hypertension Maternal Grandmother   . Diabetes Maternal Grandmother     Social History Social History  Substance Use Topics  . Smoking status: Current Every Day Smoker    Packs/day: 1.00    Years: 10.00    Types: Cigarettes  . Smokeless tobacco: Never Used  . Alcohol use No     Comment: 1 -2 beers per week     Allergies   Codeine and Pollen extract   Review of Systems Review of Systems  Constitutional: Negative for unexpected weight change.  HENT: Positive for dental problem and ear pain.   Cardiovascular: Negative for chest pain.  Gastrointestinal: Negative for abdominal pain.  Physical Exam Updated Vital Signs BP 127/86 (BP Location: Right Arm)   Pulse 94   Temp 98.2 F (36.8 C) (Oral)   Resp 20   Ht 1.829 m (6')   Wt 56.7 kg (125 lb)   SpO2 100%   BMI 16.95 kg/m   Physical Exam CONSTITUTIONAL: Well developed/well nourished HEAD: Normocephalic/atraumatic EYES: EOMI/PERRL ENMT: Mucous membranes moist, poor dentition.  Tenderness to right upper molar.  No abscess noted.  No trismus.  Normal phonation.  Right TM clear/intact.  Ears symmetric.  No facial sweling/erythema NECK: supple no meningeal signs - isolated right anterior cervical  lymphadenopathy SPINE/BACK:entire spine nontender CV: S1/S2 noted, no murmurs/rubs/gallops noted LUNGS: Lungs are clear to auscultation bilaterally, no apparent distress ABDOMEN: soft, nontender NEURO: Pt is awake/alert/appropriate, moves all extremitiesx4.  No facial droop.   SKIN: warm, color normal PSYCH: no abnormalities of mood noted, alert and oriented to situation   ED Treatments / Results  Labs (all labs ordered are listed, but only abnormal results are displayed) Labs Reviewed - No data to display  EKG  EKG Interpretation None       Radiology No results found.  Procedures Procedures    Medications Ordered in ED Medications  HYDROcodone-acetaminophen (NORCO/VICODIN) 5-325 MG per tablet 1 tablet (1 tablet Oral Given 04/23/17 2336)     Initial Impression / Assessment and Plan / ED Course  I have reviewed the triage vital signs and the nursing notes.      1. Advised pt f/u with dentistry/oral surgery for chronic dental pain.  Advised antibiotics/pain meds no longer indicated.  He reports he will f/u next week with oral surgery  2. For continued lymphadenopathy, he has already been on multiple rounds of antibiotics without improvement Advised he needs further outpatient workup including potential malignancy workup if it continues.  Pt understands this and will f/u as outpatient   Final Clinical Impressions(s) / ED Diagnoses   Final diagnoses:  Dental caries  Lymphadenopathy of head and neck    New Prescriptions Discharge Medication List as of 04/23/2017 11:32 PM       Zadie Rhine, MD 04/24/17 0111

## 2017-05-19 ENCOUNTER — Encounter (HOSPITAL_COMMUNITY): Payer: Self-pay

## 2017-05-19 ENCOUNTER — Emergency Department (HOSPITAL_COMMUNITY)
Admission: EM | Admit: 2017-05-19 | Discharge: 2017-05-19 | Disposition: A | Discharge status: Home / Self Care | Payer: Self-pay | Attending: Emergency Medicine | Admitting: Emergency Medicine

## 2017-05-19 DIAGNOSIS — F17210 Nicotine dependence, cigarettes, uncomplicated: Secondary | ICD-10-CM | POA: Insufficient documentation

## 2017-05-19 DIAGNOSIS — J329 Chronic sinusitis, unspecified: Secondary | ICD-10-CM | POA: Insufficient documentation

## 2017-05-19 DIAGNOSIS — K047 Periapical abscess without sinus: Secondary | ICD-10-CM | POA: Insufficient documentation

## 2017-05-19 MED ORDER — TRAMADOL HCL 50 MG PO TABS: 50.0000 mg | ORAL_TABLET | Freq: Four times a day (QID) | ORAL | 0 refills | Status: DC | PRN

## 2017-05-19 MED ORDER — PSEUDOEPHEDRINE HCL 60 MG PO TABS
60.0000 mg | ORAL_TABLET | Freq: Once | ORAL | Status: AC
  Administered 2017-05-19: 60 mg via ORAL
  Filled 2017-05-19: qty 1

## 2017-05-19 MED ORDER — PROMETHAZINE HCL 12.5 MG PO TABS
12.5000 mg | ORAL_TABLET | Freq: Once | ORAL | Status: AC
  Administered 2017-05-19: 12.5 mg via ORAL
  Filled 2017-05-19: qty 1

## 2017-05-19 MED ORDER — TRAMADOL HCL 50 MG PO TABS
100.0000 mg | ORAL_TABLET | Freq: Once | ORAL | Status: AC
  Administered 2017-05-19: 100 mg via ORAL
  Filled 2017-05-19: qty 2

## 2017-05-19 MED ORDER — CLINDAMYCIN HCL 150 MG PO CAPS: 150.0000 mg | ORAL_CAPSULE | Freq: Four times a day (QID) | ORAL | 0 refills | Status: DC

## 2017-05-19 MED ORDER — LORATADINE-PSEUDOEPHEDRINE ER 5-120 MG PO TB12: 1.0000 | ORAL_TABLET | Freq: Two times a day (BID) | ORAL | 0 refills | Status: DC

## 2017-05-19 MED ORDER — CLINDAMYCIN HCL 150 MG PO CAPS
300.0000 mg | ORAL_CAPSULE | Freq: Once | ORAL | Status: AC
  Administered 2017-05-19: 300 mg via ORAL
  Filled 2017-05-19: qty 2

## 2017-05-19 MED ORDER — CEFTRIAXONE SODIUM 1 G IJ SOLR
1.0000 g | Freq: Once | INTRAMUSCULAR | Status: AC
  Administered 2017-05-19: 1 g via INTRAMUSCULAR
  Filled 2017-05-19: qty 10

## 2017-05-19 NOTE — ED Triage Notes (Signed)
Reports of upper and lower dental pain. Has appt. 05/28/16 to get tooth pulled.

## 2017-05-19 NOTE — Discharge Instructions (Signed)
Please use clindamycin and Claritin-D daily. Please increase fluids. Use Tylenol every 4 hours, or ibuprofen every 6 hours for mild pain. Use Ultram for severe pain. Please see your dentist on September 25 as scheduled.

## 2017-05-19 NOTE — ED Provider Notes (Signed)
[facility] Provider Note   CSN: [id number] Arrival date & time: 05/19/17  1643     History   Chief Complaint Chief Complaint  Patient presents with  . Dental Pain    HPI Aaron Roman is a 26 y.o. male.  The history is provided by the patient.  Dental Pain   The current episode started 6 to 12 hours ago. The problem occurs hourly. The problem has been gradually worsening. The pain is moderate. He has tried nothing for the symptoms.    Past Medical History:  Diagnosis Date  . Chronic abdominal pain   . Chronic diarrhea   . Cyclical vomiting with nausea   . GERD (gastroesophageal reflux disease)     Patient Active Problem List   Diagnosis Date Noted  . Chest pain 03/02/2015  . Abdominal pain, epigastric 04/09/2014  . Nausea and vomiting 04/09/2014    Past Surgical History:  Procedure Laterality Date  . COLONOSCOPY    . COLONOSCOPY N/A 02/04/2015   Procedure: COLONOSCOPY;  Surgeon: Malissa Hippo, MD;  Location: [facility];  Service: Endoscopy;  Laterality: N/A;  145 - moved to 7:30 - Ann notified pt  . ESOPHAGOGASTRODUODENOSCOPY N/A 04/14/2014   Procedure: ESOPHAGOGASTRODUODENOSCOPY (EGD);  Surgeon: Malissa Hippo, MD;  Location: [facility];  Service: Endoscopy;  Laterality: N/A;  830  . ESOPHAGOGASTRODUODENOSCOPY N/A 02/04/2015   Procedure: ESOPHAGOGASTRODUODENOSCOPY (EGD);  Surgeon: Malissa Hippo, MD;  Location: [facility];  Service: Endoscopy;  Laterality: N/A;  . WISDOM TOOTH EXTRACTION         Home Medications    Prior to Admission medications   Medication Sig Start Date End Date Taking? Authorizing Provider  imipramine (TOFRANIL) 25 MG tablet Take 1 tablet (25 mg total) by mouth at bedtime. Patient not taking: Reported on 11/09/2016 08/02/15   Malissa Hippo, MD  magic mouthwash w/lidocaine SOLN Take 5 mLs by mouth 3 (three) times daily as needed for mouth pain. Swish and spit, do not swallow Patient not taking: Reported on 11/09/2016  09/29/15   Triplett, Tammy, PA-C  ondansetron (ZOFRAN ODT) 4 MG disintegrating tablet Take 1 tablet (4 mg total) by mouth every 8 (eight) hours as needed for nausea or vomiting. 11/09/16   Samuel Jester, DO    Family History Family History  Problem Relation Age of Onset  . Heart disease Father   . Hypertension Maternal Grandmother   . Diabetes Maternal Grandmother     Social History Social History  Substance Use Topics  . Smoking status: Current Every Day Smoker    Packs/day: 1.00    Years: 10.00    Types: Cigarettes  . Smokeless tobacco: Never Used  . Alcohol use No     Comment: 1 -2 beers per week     Allergies   Codeine and Pollen extract   Review of Systems Review of Systems  Constitutional: Negative for activity change and fever.       All ROS Neg except as noted in HPI  HENT: Positive for congestion and dental problem. Negative for nosebleeds.   Eyes: Negative for photophobia and discharge.  Respiratory: Negative for cough, shortness of breath and wheezing.   Cardiovascular: Negative for chest pain and palpitations.  Gastrointestinal: Negative for abdominal pain and blood in stool.  Genitourinary: Negative for dysuria, frequency and hematuria.  Musculoskeletal: Negative for arthralgias, back pain and neck pain.  Skin: Negative.   Neurological: Positive for headaches. Negative for dizziness, seizures and speech difficulty.  Psychiatric/Behavioral: Negative for confusion and hallucinations.     Physical Exam Updated Vital Signs BP 122/85 (BP Location: Right Arm)   Pulse 92   Temp 98.4 F (36.9 C) (Oral)   Resp 18   Ht 6' (1.829 m)   Wt 56.7 kg (125 lb)   SpO2 99%   BMI 16.95 kg/m   Physical Exam  Constitutional: He is oriented to person, place, and time. He appears well-developed and well-nourished.  Non-toxic appearance.  HENT:  Head: Normocephalic.  Right Ear: Tympanic membrane and external ear normal.  Left Ear: Tympanic membrane and external  ear normal.  There is poor dentition of the upper and lower teeth. There are areas of the upper and lower gum that are swollen.Right upper molar area more that other areas. The airway is patent. There is no swelling under the tongue.  There is nasal congestion present. There is no increased redness or swelling of the mastoid areas.  Eyes: Pupils are equal, round, and reactive to light. EOM and lids are normal.  Neck: Normal range of motion. Neck supple. Carotid bruit is not present.  Cardiovascular: Normal rate, regular rhythm, intact distal pulses and normal pulses.  Exam reveals no friction rub.   Murmur heard.  Systolic murmur is present with a grade of 2/6  Pulmonary/Chest: Breath sounds normal. No respiratory distress.  Abdominal: Soft. Bowel sounds are normal. There is no tenderness. There is no guarding.  Musculoskeletal: Normal range of motion.  Lymphadenopathy:       Head (right side): No submandibular adenopathy present.       Head (left side): No submandibular adenopathy present.    He has no cervical adenopathy.  Neurological: He is alert and oriented to person, place, and time. He has normal strength. No cranial nerve deficit or sensory deficit.  Skin: Skin is warm and dry.  Psychiatric: He has a normal mood and affect. His speech is normal.  Nursing note and vitals reviewed.    ED Treatments / Results  Labs (all labs ordered are listed, but only abnormal results are displayed) Labs Reviewed - No data to display  EKG  EKG Interpretation None       Radiology No results found.  Procedures Procedures (including critical care time)  Medications Ordered in ED Medications - No data to display   Initial Impression / Assessment and Plan / ED Course  I have reviewed the triage vital signs and the nursing notes.  Pertinent labs & imaging results that were available during my care of the patient were reviewed by me and considered in my medical decision making (see  chart for details).       Final Clinical Impressions(s) / ED Diagnoses MDM Patient is a dental problems for several months on. This particular episode has gotten worse over the last few days. The patient also has nasal congestion and sensation of fullness in the back of his head on. Vital signs within normal limits. There no gross neurologic deficits appreciated. There is no evidence of Ludwig's Angina or other emergent oral situations. The patient is treated in the emergency department with intramuscular Rocephin and oral clindamycin. The patient will be treated with clindamycin as an outpatient. He is scheduled to see a dentist on September 25. He will use Claritin-D for congestion.    Final diagnoses:  Dental infection  Other sinusitis, unspecified chronicity    New Prescriptions New Prescriptions   CLINDAMYCIN (CLEOCIN) 150 MG CAPSULE    Take 1 capsule (150 mg  total) by mouth every 6 (six) hours.   LORATADINE-PSEUDOEPHEDRINE (CLARITIN-D 12 HOUR) 5-120 MG TABLET    Take 1 tablet by mouth 2 (two) times daily.   TRAMADOL (ULTRAM) 50 MG TABLET    Take 1 tablet (50 mg total) by mouth every 6 (six) hours as needed. Take with food     Ivery Quale, PA-C 05/19/17 1817    Bethann Berkshire, MD 05/19/17 2037

## 2018-05-05 ENCOUNTER — Encounter (HOSPITAL_COMMUNITY): Payer: Self-pay | Admitting: Emergency Medicine

## 2018-05-05 ENCOUNTER — Emergency Department (HOSPITAL_COMMUNITY)
Admission: EM | Admit: 2018-05-05 | Discharge: 2018-05-05 | Disposition: A | Discharge status: Home / Self Care | Payer: Self-pay | Attending: Emergency Medicine | Admitting: Emergency Medicine

## 2018-05-05 ENCOUNTER — Emergency Department (HOSPITAL_COMMUNITY): Payer: Self-pay

## 2018-05-05 DIAGNOSIS — R51 Headache: Secondary | ICD-10-CM | POA: Insufficient documentation

## 2018-05-05 DIAGNOSIS — F17210 Nicotine dependence, cigarettes, uncomplicated: Secondary | ICD-10-CM | POA: Insufficient documentation

## 2018-05-05 DIAGNOSIS — Z79899 Other long term (current) drug therapy: Secondary | ICD-10-CM | POA: Insufficient documentation

## 2018-05-05 DIAGNOSIS — R519 Headache, unspecified: Secondary | ICD-10-CM

## 2018-05-05 DIAGNOSIS — R0789 Other chest pain: Secondary | ICD-10-CM | POA: Insufficient documentation

## 2018-05-05 LAB — CBC WITH DIFFERENTIAL/PLATELET
Basophils Absolute: 0 10*3/uL (ref 0.0–0.1)
Basophils Relative: 0 %
EOS ABS: 0.1 10*3/uL (ref 0.0–0.7)
Eosinophils Relative: 1 %
HCT: 46.2 % (ref 39.0–52.0)
HEMOGLOBIN: 15.5 g/dL (ref 13.0–17.0)
LYMPHS ABS: 2.4 10*3/uL (ref 0.7–4.0)
Lymphocytes Relative: 43 %
MCH: 25.8 pg — AB (ref 26.0–34.0)
MCHC: 33.5 g/dL (ref 30.0–36.0)
MCV: 77 fL — ABNORMAL LOW (ref 78.0–100.0)
Monocytes Absolute: 0.5 10*3/uL (ref 0.1–1.0)
Monocytes Relative: 8 %
NEUTROS ABS: 2.7 10*3/uL (ref 1.7–7.7)
NEUTROS PCT: 48 %
Platelets: 541 10*3/uL — ABNORMAL HIGH (ref 150–400)
RBC: 6 MIL/uL — AB (ref 4.22–5.81)
RDW: 14.3 % (ref 11.5–15.5)
WBC: 5.6 10*3/uL (ref 4.0–10.5)

## 2018-05-05 LAB — BASIC METABOLIC PANEL
Anion gap: 12 (ref 5–15)
BUN: 13 mg/dL (ref 6–20)
CHLORIDE: 100 mmol/L (ref 98–111)
CO2: 26 mmol/L (ref 22–32)
Calcium: 9.6 mg/dL (ref 8.9–10.3)
Creatinine, Ser: 0.96 mg/dL (ref 0.61–1.24)
GFR calc Af Amer: >60 mL/min (ref >60)
GFR calc non Af Amer: >60 mL/min (ref >60)
GLUCOSE: 124 mg/dL — AB (ref 70–99)
POTASSIUM: 3.9 mmol/L (ref 3.5–5.1)
SODIUM: 138 mmol/L (ref 135–145)

## 2018-05-05 LAB — I-STAT TROPONIN, ED: TROPONIN I, POC: 0.01 ng/mL (ref 0.00–0.08)

## 2018-05-05 MED ORDER — TRAMADOL HCL 50 MG PO TABS: ORAL_TABLET | ORAL | 0 refills | Status: DC

## 2018-05-05 MED ORDER — PROMETHAZINE HCL 12.5 MG PO TABS
12.5000 mg | ORAL_TABLET | Freq: Once | ORAL | Status: AC
  Administered 2018-05-05: 12.5 mg via ORAL
  Filled 2018-05-05: qty 1

## 2018-05-05 MED ORDER — LORATADINE-PSEUDOEPHEDRINE ER 5-120 MG PO TB12: 1.0000 | ORAL_TABLET | Freq: Two times a day (BID) | ORAL | 0 refills | Status: DC

## 2018-05-05 MED ORDER — HYDROCODONE-ACETAMINOPHEN 5-325 MG PO TABS
2.0000 | ORAL_TABLET | Freq: Once | ORAL | Status: AC
  Administered 2018-05-05: 2 via ORAL
  Filled 2018-05-05: qty 2

## 2018-05-05 MED ORDER — HYDROMORPHONE HCL 1 MG/ML IJ SOLN
0.5000 mg | Freq: Once | INTRAMUSCULAR | Status: AC
  Administered 2018-05-05: 0.5 mg via INTRAVENOUS
  Filled 2018-05-05: qty 1

## 2018-05-05 NOTE — Discharge Instructions (Signed)
Your examination shows evidence of sinus congestion.  Your neurologic examination is within normal limits.  There are some changes on your electrocardiogram that is been present over the last year and a half.  Your heart rate was elevated during a large part of your visit here in the emergency department.  Please see the cardiology specialist on your discharge paperwork for follow-up concerning this EKG change.  Your heart enzymes and your EKG are negative for an acute heart event.  Your chest x-ray is negative.  The CT scan of your head shows no evidence of any acute problems to suggest a reason for your headache.  I do find it to have some sinus congestion.  Please use Claritin-D to assist with this.  Hopefully this will help improve the headache pain.  Please use 600 mg of ibuprofen, and 1000 mg of Tylenol with breakfast, lunch, dinner, and at bedtime.  Please use Ultram every 6 hours as needed for more severe pain.  Ultram may cause drowsiness.  Please do not drive a vehicle, operate machinery, or participate in activities requiring concentration when taking this medication.

## 2018-05-05 NOTE — ED Provider Notes (Signed)
Medical screening examination/treatment/procedure(s) were conducted as a shared visit with non-physician practitioner(s) and myself.  I personally evaluated the patient during the encounter.  EKG Interpretation  Date/Time:  Monday May 05 2018 12:00:01 EDT Ventricular Rate:  111 PR Interval:    QRS Duration: 112 QT Interval:  324 QTC Calculation: 441 R Axis:   109 Text Interpretation:  Sinus tachycardia with irregular rate Consider right atrial enlargement IRBBB and LPFB No significant change since last tracing Confirmed by Vanetta Mulders [phone]) on 05/05/2018 3:03:25 PM  Patient seen by me along with the physician assistant.  Patient presents today with complaints of some sharp chest pain starting this morning and some sharp pain back of the head for about 2 weeks around the right ear mastoid area.  Work-up to include CT head without acute findings.  EKG had some abnormal findings of right bundle branch block kind of pattern it was also present in 2016 so no real change however he go back into 10/2000 obviously was pediatric age group at that time it was not looking like this at all he had normal QRS durations.  So based on this we have recommended referral and follow-up to cardiology did not think admission is anything to do with why he was here today.  In discussion with the physician assistant and the patient we feel that the other symptoms are kind of sinus inflammatory nature yes the CT did not show any acute sinusitis.  But we will treat as if that could be the cause.  No concerns for meningitis.  No concerns for pulmonary embolus.  Patient is alert and oriented.   Vanetta Mulders, MD 05/05/18 [phone]

## 2018-05-05 NOTE — ED Provider Notes (Signed)
[facility] EMERGENCY DEPARTMENT Provider Note   CSN: [id number] Arrival date & time: 05/05/18  1152     History   Chief Complaint Chief Complaint  Patient presents with  . Chest Pain  . Headache    HPI Aaron Roman is a 27 y.o. male.  Patient is a 27 year old male who presents to the emergency department with a complaint of sharp chest pain and headache.  The patient states that earlier this morning he developed a sharp pain in the mid to the left chest.  This pain can be aggravated by certain movements of the upper extremities.  It does not seem to cause any shortness of breath.  There was no loss of consciousness during this time.  And no palpitations.  The patient denies any recent injury or trauma to the chest.  No recent operations or procedures.  No fever or chills has been reported.  No unusual sweats.  The patient is a smoker.  He denies any recreational or street related drugs.  Particular he denies any IV drugs.  The headache is been going on for about 2 weeks.  It seems to be behind his ear and then coming around to under the right eye.  The patient has not had any problems with his coordination as far as his upper extremities or lower extremities.  No falls related.  No difficulty with speaking.  No problems with balance.  The history is provided by the patient.  Chest Pain   Associated symptoms include headaches. Pertinent negatives include no abdominal pain, no back pain, no cough, no dizziness, no palpitations and no shortness of breath.  Pertinent negatives for past medical history include no seizures.  Headache   Pertinent negatives include no palpitations and no shortness of breath.    Past Medical History:  Diagnosis Date  . Chronic abdominal pain   . Chronic diarrhea   . Cyclical vomiting with nausea   . GERD (gastroesophageal reflux disease)     Patient Active Problem List   Diagnosis Date Noted  . Chest pain 03/02/2015  . Abdominal pain, epigastric  04/09/2014  . Nausea and vomiting 04/09/2014    Past Surgical History:  Procedure Laterality Date  . COLONOSCOPY    . COLONOSCOPY N/A 02/04/2015   Procedure: COLONOSCOPY;  Surgeon: Malissa Hippo, MD;  Location: [facility];  Service: Endoscopy;  Laterality: N/A;  145 - moved to 7:30 - Ann notified pt  . ESOPHAGOGASTRODUODENOSCOPY N/A 04/14/2014   Procedure: ESOPHAGOGASTRODUODENOSCOPY (EGD);  Surgeon: Malissa Hippo, MD;  Location: [facility];  Service: Endoscopy;  Laterality: N/A;  830  . ESOPHAGOGASTRODUODENOSCOPY N/A 02/04/2015   Procedure: ESOPHAGOGASTRODUODENOSCOPY (EGD);  Surgeon: Malissa Hippo, MD;  Location: [facility];  Service: Endoscopy;  Laterality: N/A;  . WISDOM TOOTH EXTRACTION          Home Medications    Prior to Admission medications   Medication Sig Start Date End Date Taking? Authorizing Provider  cetirizine (ZYRTEC) 10 MG tablet Take 5 mg by mouth daily. 03/31/18  Yes [provider]  hydrOXYzine (VISTARIL) 25 MG capsule Take 1 capsule by mouth at bedtime as needed. 03/31/18  Yes [provider]  sertraline (ZOLOFT) 100 MG tablet Take 100 mg by mouth daily. 03/31/18  Yes [provider]    Family History Family History  Problem Relation Age of Onset  . Heart disease Father   . Hypertension Maternal Grandmother   . Diabetes Maternal Grandmother  Social History Social History   Tobacco Use  . Smoking status: Current Every Day Smoker    Packs/day: 1.00    Years: 10.00    Pack years: 10.00    Types: Cigarettes  . Smokeless tobacco: Never Used  Substance Use Topics  . Alcohol use: No    Alcohol/week: 0.0 standard drinks    Comment: 1 -2 beers per week  . Drug use: Not Currently    Types: Oxycodone, Hydrocodone, Marijuana     Allergies   Codeine and Pollen extract   Review of Systems Review of Systems  Constitutional: Negative for activity change.       All ROS Neg except as noted in HPI  HENT: Positive  for congestion. Negative for nosebleeds.   Eyes: Negative for photophobia and discharge.  Respiratory: Negative for cough, shortness of breath and wheezing.   Cardiovascular: Positive for chest pain. Negative for palpitations.  Gastrointestinal: Negative for abdominal pain and blood in stool.  Genitourinary: Negative for dysuria, frequency and hematuria.  Musculoskeletal: Negative for arthralgias, back pain and neck pain.  Skin: Negative.   Neurological: Positive for headaches. Negative for dizziness, seizures and speech difficulty.  Psychiatric/Behavioral: Negative for confusion and hallucinations.     Physical Exam Updated Vital Signs BP (!) 119/94   Pulse (!) 106   Resp 14   Ht 6' (1.829 m)   Wt 65.3 kg   SpO2 98%   BMI 19.53 kg/m   Physical Exam  Constitutional: He appears well-developed and well-nourished. No distress.  HENT:  Head: Normocephalic and atraumatic. Head is without Battle's sign and without contusion.  Right Ear: External ear normal.  Left Ear: External ear normal.  Nasal congestion present. Mild mastoid tenderness, but no redness or swelling.  Eyes: Conjunctivae are normal. Right eye exhibits no discharge. Left eye exhibits no discharge. No scleral icterus.  Neck: Neck supple. No tracheal deviation present.  Cardiovascular: Normal rate, regular rhythm and intact distal pulses.  Pulmonary/Chest: Effort normal and breath sounds normal. No stridor. No respiratory distress. He has no wheezes. He has no rales.  Abdominal: Soft. Bowel sounds are normal. He exhibits no distension. There is no tenderness. There is no rebound and no guarding.  Musculoskeletal: He exhibits no edema or tenderness.  No hot joints.  Neurological: He is alert. He has normal strength. No cranial nerve deficit (no facial droop, extraocular movements intact, no slurred speech) or sensory deficit. He exhibits normal muscle tone. He displays no seizure activity. Coordination normal.  Skin:  Skin is warm and dry. No rash noted.  Psychiatric: He has a normal mood and affect.  Nursing note and vitals reviewed.    ED Treatments / Results  Labs (all labs ordered are listed, but only abnormal results are displayed) Labs Reviewed  CBC WITH DIFFERENTIAL/PLATELET - Abnormal; Notable for the following components:      Result Value   RBC 6.00 (*)    MCV 77.0 (*)    MCH 25.8 (*)    Platelets 541 (*)    All other components within normal limits  BASIC METABOLIC PANEL - Abnormal; Notable for the following components:   Glucose, Bld 124 (*)    All other components within normal limits  I-STAT TROPONIN, ED    EKG None  Radiology Dg Chest 2 View  Result Date: 05/05/2018 CLINICAL DATA:  27 year old male with sharp chest pain EXAM: CHEST - 2 VIEW COMPARISON:  Acute abdominal series including chest x-ray 11/09/2016 FINDINGS: The lungs are clear  and negative for focal airspace consolidation, pulmonary edema or suspicious pulmonary nodule. No pleural effusion or pneumothorax. Cardiac and mediastinal contours are within normal limits. No acute fracture or lytic or blastic osseous lesions. The visualized upper abdominal bowel gas pattern is unremarkable. IMPRESSION: Normal chest x-ray. Electronically Signed   By: Malachy Moan M.D.   On: 05/05/2018 12:32   Ct Head Wo Contrast  Result Date: 05/05/2018 CLINICAL DATA:  Headache for 2 weeks. EXAM: CT HEAD WITHOUT CONTRAST TECHNIQUE: Contiguous axial images were obtained from the base of the skull through the vertex without intravenous contrast. COMPARISON:  None. FINDINGS: Brain: No evidence of acute infarction, hemorrhage, hydrocephalus, extra-axial collection or mass lesion/mass effect. Vascular: No hyperdense vessel or unexpected calcification. Skull: Normal. Negative for fracture or focal lesion. Sinuses/Orbits: Negative. Other: None. IMPRESSION: Negative head CT. Electronically Signed   By: Drusilla Kanner M.D.   On: 05/05/2018 14:44     Procedures Procedures (including critical care time)  Medications Ordered in ED Medications  HYDROcodone-acetaminophen (NORCO/VICODIN) 5-325 MG per tablet 2 tablet (2 tablets Oral Given 05/05/18 1339)  promethazine (PHENERGAN) tablet 12.5 mg (12.5 mg Oral Given 05/05/18 1339)     Initial Impression / Assessment and Plan / ED Course  I have reviewed the triage vital signs and the nursing notes.  Pertinent labs & imaging results that were available during my care of the patient were reviewed by me and considered in my medical decision making (see chart for details).       Final Clinical Impressions(s) / ED Diagnoses MDM  The blood pressure varies in range during the emergency department visit.  The patient mother states that he has some aortic valve disorders and has been having problems with his blood pressure.  The patient had some tachycardia, however after pain was better controlled the tachycardia improved.  The pulse oximetry was between 96 and 98% on room air.  Some of the pain can be reproduced by palpation of the chest wall anteriorly.  Patient has nasal congestion.  Question if there may be some inflammation involving the mastoid sinus and congestion in the anterior nasal passages.  The electric cardiogram shows a tachycardia of 111 bpm.  There is question of right atrial enlargement and right bundle branch block.  This was compared to previous electrocardiogram findings and was similar.  The troponin is negative for acute cardiac event at 0.01.  The complete blood count is negative for acute event.  Doubt infectious process.  Doubt that this is related to any unusual anemia.  There is no report of sickle cells noted on the complete blood count. The basic metabolic panel is within normal limits.  The anion gap is normal at 12.  The CT head scan is negative for skull fracture, or intracranial abnormality.  The x-ray of the chest is read as normal.  I have discussed the  findings on examination as well as the findings on the labs, scans, and x-rays with the patient and the patient's family in terms of which they understand.  Review of the previous EKGs shows 1 similar, however one during more early pediatric reading did not show these right bundle branch changes or other changes.  I have asked the patient to see cardiology for review and evaluation.  Especially since the patient is now having some chest discomfort.  I find no evidence at this time of acute cardiac event.  I feel that it is safe for the patient to be discharged home with a  close outpatient follow-up and evaluation the examination favors sinusitis.  Question if this may be related to some of the headache situation.  No gross neurologic deficits appreciated.  Gait and balance seem to be within normal limits.  Patient will use Claritin-D patient will use Tylenol extra strength, or ibuprofen for mild pain.  Patient will use Ultram for more severe pain.  Questions were answered.  Patient will return to the emergency department if any changes in condition, problems, or concerns.   Final diagnoses:  None    ED Discharge Orders         Ordered    traMADol (ULTRAM) 50 MG tablet     05/05/18 1604    loratadine-pseudoephedrine (CLARITIN-D 12 HOUR) 5-120 MG tablet  2 times daily     05/05/18 1604           Ivery Quale, PA-C 05/06/18 1501    Bethann Berkshire, MD 05/07/18 [phone]

## 2018-05-05 NOTE — ED Notes (Signed)
Pt returned from xray. Nad. Pt resting.

## 2018-05-05 NOTE — ED Triage Notes (Signed)
Pt reports sharp chest pain starting this morning.  Sharp pain in back of head x 2 weeks.

## 2018-05-07 ENCOUNTER — Ambulatory Visit (INDEPENDENT_AMBULATORY_CARE_PROVIDER_SITE_OTHER): Payer: Self-pay | Admitting: Student

## 2018-05-07 ENCOUNTER — Encounter: Payer: Self-pay | Admitting: Student

## 2018-05-07 VITALS — BP 116/80 | HR 66 | Ht 72.0 in | Wt 138.0 lb

## 2018-05-07 DIAGNOSIS — I351 Nonrheumatic aortic (valve) insufficiency: Secondary | ICD-10-CM

## 2018-05-07 DIAGNOSIS — Z720 Tobacco use: Secondary | ICD-10-CM

## 2018-05-07 DIAGNOSIS — R079 Chest pain, unspecified: Secondary | ICD-10-CM

## 2018-05-07 NOTE — Patient Instructions (Signed)
Medication Instructions:  Your physician recommends that you continue on your current medications as directed. Please refer to the Current Medication list given to you today.  Labwork: NONE  Testing/Procedures: Your physician has requested that you have a stress echocardiogram. For further information please visit [email or web address] Please follow instruction sheet as given.    Follow-Up: Your physician wants you to follow-up in: 1 YEAR.  You will receive a reminder letter in the mail two months in advance. If you don't receive a letter, please call our office to schedule the follow-up appointment.   Any Other Special Instructions Will Be Listed Below (If Applicable).     If you need a refill on your cardiac medications before your next appointment, please call your pharmacy.  Exercise Stress Echocardiogram, Care After This sheet gives you information about how to care for yourself after your procedure. Your doctor may also give you more specific instructions. If you have problems or questions, call your doctor. Follow these instructions at home:  You may do these things as told by your doctor: ? Eat what you normally eat. ? Do your normal activities. ? Take your normal medicines.  Take over-the-counter and prescription medicines only as told by your doctor.  Keep all follow-up visits as told by your doctor. This is important. Contact a doctor if:  You keep feeling dizzy or light-headed.  You feel like your heart is beating fast.  You keep feeling sick to your stomach (nauseous) or you throw up (vomit).  You have a headache.  You feel short of breath. Get help right away if:  You have pain or pressure in any of these areas: ? Your chest. ? Your jaw or neck. ? Between your shoulder blades. ? Down your left arm.  You pass out (faint).  You have trouble breathing. Summary  After your procedure, you may eat like normal, do your normal activities, and take your  normal medicines as told by your doctor.  Contact your doctor if you have dizziness, a fast heartbeat, or a headache.  You should also contact your doctor if you feel sick to your stomach (nauseous), you throw up (vomit), or you feel short of breath.  Get help right away if you feel pain or pressure in any of these areas: your chest, jaw, neck, between your shoulder blades, or down your right arm.  You should also get help right away if you pass out (faint) or have trouble breathing. This information is not intended to replace advice given to you by your health care provider. Make sure you discuss any questions you have with your health care provider. Document Released: 06/10/2013 Document Revised: 05/14/2016 Document Reviewed: 05/14/2016 Elsevier Interactive Patient Education  2017 [employer].

## 2018-05-07 NOTE — Progress Notes (Signed)
Cardiology Office Note    Date:  05/07/2018   ID:  JANN RA, DOB [birth year], MRN [id number]  PCP:  Patient, No Pcp Per  Cardiologist: Prentice Docker, MD  (last evaluated in 02/2015)  Chief Complaint  Patient presents with  . Follow-up    recent Emergency Dept visit for chest pain    History of Present Illness:    Aaron Roman is a 27 y.o. male with past medical history of GERD and tobacco use who presents to the office today for follow-up from a recent emergency department visit.  He was last examined by Dr. Purvis Sheffield in 02/2015 for follow-up from a recent emergency department visit for chest pain which was thought to be atypical for a cardiac etiology.  Symptoms were thought to be most related to GERD and antral gastritis but he did report a history of aortic valve malformation, therefore a repeat echocardiogram was recommended for further assessment. This was performed in 05/2015 and showed a preserved EF of 55 to 60% with mild aortic regurgitation.  He was most recently evaluated at [facility] ED on 05/05/2018 for chest discomfort and worsening headaches.  He reported having a sharp pain along his mid to left upper chest and there was no association with exertion. His pain was reproducible on palpation and overall felt to be atypical for a cardiac etiology. His EKG was similar to prior tracings in 2016 and showed a known right bundle branch block. Initial troponin was negative and he was discharged home and informed to follow-up with Cardiology in the outpatient setting.  In talking with the patient today, he reports not being overly active at baseline but denies any episodes of chest discomfort prior to his recent ED visit. Reports that he was awoke from sleep with stabbing pains along his left pectoral region which would last for 20 to 30-minute intervals and then spontaneously resolve. Symptoms persisted throughout the day and did not improve with Tramadol which was provided upon  discharge from the ED. Feels that symptoms were worse with walking around his home. He denies any recurrent symptoms yesterday or today. No recent dyspnea on exertion, orthopnea, PND, lower extremity edema, or palpitations. No recent fever, chills, or sick contacts.   He does have a 10+ pack year tobacco history. Was previously using marijuana regularly but has not done so in over a month. Denies any alcohol use or illicit drug use. He reports that his father died from a heart attack but is unable to give specific details about the age upon which this occurred. Reports that his mother and maternal grandmother have a history of HTN, HLD, and Type 2 DM. Patient denies any personal history of HTN, HLD, or Type 2 DM.   Past Medical History:  Diagnosis Date  . Chronic abdominal pain   . Chronic diarrhea   . Cyclical vomiting with nausea   . GERD (gastroesophageal reflux disease)     Past Surgical History:  Procedure Laterality Date  . COLONOSCOPY    . COLONOSCOPY N/A 02/04/2015   Procedure: COLONOSCOPY;  Surgeon: Malissa Hippo, MD;  Location: [facility];  Service: Endoscopy;  Laterality: N/A;  145 - moved to 7:30 - Ann notified pt  . ESOPHAGOGASTRODUODENOSCOPY N/A 04/14/2014   Procedure: ESOPHAGOGASTRODUODENOSCOPY (EGD);  Surgeon: Malissa Hippo, MD;  Location: [facility];  Service: Endoscopy;  Laterality: N/A;  830  . ESOPHAGOGASTRODUODENOSCOPY N/A 02/04/2015   Procedure: ESOPHAGOGASTRODUODENOSCOPY (EGD);  Surgeon: Malissa Hippo, MD;  Location:  [facility];  Service: Endoscopy;  Laterality: N/A;  . WISDOM TOOTH EXTRACTION      Current Medications: Outpatient Medications Prior to Visit  Medication Sig Dispense Refill  . cetirizine (ZYRTEC) 10 MG tablet Take 5 mg by mouth daily.  5  . hydrOXYzine (VISTARIL) 25 MG capsule Take 1 capsule by mouth at bedtime as needed.  5  . loratadine-pseudoephedrine (CLARITIN-D 12 HOUR) 5-120 MG tablet Take 1 tablet by mouth 2 (two) times daily. 20  tablet 0  . sertraline (ZOLOFT) 100 MG tablet Take 100 mg by mouth daily.  5  . traMADol (ULTRAM) 50 MG tablet 1 or 2 po q6h prn pain. 12 tablet 0   No facility-administered medications prior to visit.      Allergies:   Codeine and Pollen extract   Social History   Socioeconomic History  . Marital status: Single    Spouse name: Not on file  . Number of children: Not on file  . Years of education: Not on file  . Highest education level: Not on file  Occupational History  . Not on file  Social Needs  . Financial resource strain: Not on file  . Food insecurity:    Worry: Not on file    Inability: Not on file  . Transportation needs:    Medical: Not on file    Non-medical: Not on file  Tobacco Use  . Smoking status: Current Every Day Smoker    Packs/day: 1.00    Years: 10.00    Pack years: 10.00    Types: Cigarettes  . Smokeless tobacco: Never Used  Substance and Sexual Activity  . Alcohol use: No    Alcohol/week: 0.0 standard drinks    Comment: 1 -2 beers per week  . Drug use: Not Currently    Types: Oxycodone, Hydrocodone, Marijuana    Comment: does not use  . Sexual activity: Not on file  Lifestyle  . Physical activity:    Days per week: Not on file    Minutes per session: Not on file  . Stress: Not on file  Relationships  . Social connections:    Talks on phone: Not on file    Gets together: Not on file    Attends religious service: Not on file    Active member of club or organization: Not on file    Attends meetings of clubs or organizations: Not on file    Relationship status: Not on file  Other Topics Concern  . Not on file  Social History Narrative  . Not on file     Family History:  The patient'sfamily history includes Diabetes in his maternal grandmother; Heart disease in his father; Hypertension in his maternal grandmother.   Review of Systems:   Please see the history of present illness.     General:  No chills, fever, night sweats or weight  changes.  Cardiovascular:  No dyspnea on exertion, edema, orthopnea, palpitations, paroxysmal nocturnal dyspnea. Positive for chest pain.  Dermatological: No rash, lesions/masses Respiratory: No cough, dyspnea Urologic: No hematuria, dysuria Abdominal:   No nausea, vomiting, diarrhea, bright red blood per rectum, melena, or hematemesis Neurologic:  No visual changes, wkns, changes in mental status. All other systems reviewed and are otherwise negative except as noted above.   Physical Exam:    VS:  BP 116/80 (BP Location: Right Arm)   Pulse 66   Ht 6' (1.829 m)   Wt 138 lb (62.6 kg)   SpO2 93%  BMI 18.72 kg/m    General: Well developed, well nourished,male appearing in no acute distress. Head: Normocephalic, atraumatic, sclera non-icteric, no xanthomas, nares are without discharge.  Neck: No carotid bruits. JVD not elevated.  Lungs: Respirations regular and unlabored, without wheezes or rales.  Heart: Regular rate and rhythm. No S3 or S4.  No murmur, no rubs, or gallops appreciated. Abdomen: Soft, non-tender, non-distended with normoactive bowel sounds. No hepatomegaly. No rebound/guarding. No obvious abdominal masses. Msk:  Strength and tone appear normal for age. No joint deformities or effusions. Extremities: No clubbing or cyanosis. No lower extremity edema.  Distal pedal pulses are 2+ bilaterally. Neuro: Alert and oriented X 3. Moves all extremities spontaneously. No focal deficits noted. Psych:  Responds to questions appropriately with a normal affect. Skin: No rashes or lesions noted  Wt Readings from Last 3 Encounters:  05/07/18 138 lb (62.6 kg)  05/05/18 144 lb (65.3 kg)  05/19/17 125 lb (56.7 kg)     Studies/Labs Reviewed:   EKG:  EKG is not ordered today. EKG from 05/05/2018 is reviewed which shows sinus tachycardia, HR 111, with RBBB.   Recent Labs: 05/05/2018: BUN 13; Creatinine, Ser 0.96; Hemoglobin 15.5; Platelets 541; Potassium 3.9; Sodium 138   Lipid  Panel No results found for: CHOL, TRIG, HDL, CHOLHDL, VLDL, LDLCALC, LDLDIRECT  Additional studies/ records that were reviewed today include:   Echocardiogram: 05/2015 Study Conclusions  - Left ventricle: The cavity size was normal. Wall thickness was   normal. Systolic function was normal. The estimated ejection   fraction was in the range of 55% to 60%. Wall motion was normal;   there were no regional wall motion abnormalities. Left   ventricular diastolic function parameters were normal. - Aortic valve: Trileaflet. There was no stenosis. There was mild   regurgitation. - Systemic veins: IVC upper normal to mildly dilated with normal   respiratory variation. Estimated CVP 8 mmHg.  Assessment:    1. Chest pain, unspecified type   2. Aortic valve insufficiency, etiology of cardiac valve disease unspecified   3. Tobacco use      Plan:   In order of problems listed above:  1. Chest Pain with Mixed Features - The patient was recently evaluated in the ED for episodes of chest discomfort which awoke him from sleep.  He describes this as a stabbing pain which would last for 20 to 30-minute intervals and was not improved with the use of NSAID's. Says that symptoms were worse when walking around his home and in the emergency department. - Initial troponin was negative and his EKG showed a known right bundle branch block with no acute ischemic changes when compared to prior tracings. His cardiac risk factors do include continued tobacco use and a family history of presumed premature CAD in his father (patient is unaware of the specific details surrounding this). Given his baseline abnormal EKG, will obtain a stress echocardiogram for further ischemic and structural evaluation.  2. Aortic Regurgitation - Mild regurgitation by echocardiogram in 05/2015. No significant murmur appreciated on examination today.  3. Tobacco Use - he continues to smoke 0.5 - 1.0 ppd. Cessation advised.     Medication Adjustments/Labs and Tests Ordered: Current medicines are reviewed at length with the patient today.  Concerns regarding medicines are outlined above.  Medication changes, Labs and Tests ordered today are listed in the Patient Instructions below. Patient Instructions  Medication Instructions:  Your physician recommends that you continue on your current medications as directed. Please refer  to the Current Medication list given to you today.  Labwork: NONE  Testing/Procedures: Your physician has requested that you have a stress echocardiogram. For further information please visit [email or web address] Please follow instruction sheet as given.   Follow-Up: Your physician wants you to follow-up in: 1 YEAR.  You will receive a reminder letter in the mail two months in advance. If you don't receive a letter, please call our office to schedule the follow-up appointment.   Any Other Special Instructions Will Be Listed Below (If Applicable).  If you need a refill on your cardiac medications before your next appointment, please call your pharmacy.    Signed, Ellsworth Lennox, PA-C  05/07/2018 4:57 PM    [facility] [address] [place], [place] [address] Phone: [phone]

## 2018-05-14 ENCOUNTER — Ambulatory Visit (HOSPITAL_COMMUNITY)
Admission: RE | Admit: 2018-05-14 | Discharge: 2018-05-14 | Disposition: A | Discharge status: Home / Self Care | Payer: Self-pay | Source: Ambulatory Visit | Attending: Student | Admitting: Student

## 2018-05-14 DIAGNOSIS — Z720 Tobacco use: Secondary | ICD-10-CM | POA: Insufficient documentation

## 2018-05-14 DIAGNOSIS — R079 Chest pain, unspecified: Secondary | ICD-10-CM | POA: Insufficient documentation

## 2018-05-14 DIAGNOSIS — K219 Gastro-esophageal reflux disease without esophagitis: Secondary | ICD-10-CM | POA: Insufficient documentation

## 2018-05-14 LAB — ECHOCARDIOGRAM STRESS TEST
CHL CUP RESTING HR STRESS: 83 {beats}/min
CHL RATE OF PERCEIVED EXERTION: 12
CSEPED: 9 min
CSEPEDS: 13 s
CSEPHR: 92 %
Estimated workload: 10.1 METS
MPHR: 191 {beats}/min
Peak HR: 176 {beats}/min

## 2018-05-14 NOTE — Progress Notes (Signed)
*  PRELIMINARY RESULTS* Echocardiogram Echocardiogram Stress Test has been performed.  Stacey Drain 05/14/2018, 9:27 AM

## 2018-06-05 ENCOUNTER — Encounter

## 2018-06-05 ENCOUNTER — Ambulatory Visit: Payer: Self-pay | Admitting: Student

## 2019-09-11 ENCOUNTER — Telehealth: Payer: Self-pay | Admitting: Cardiovascular Disease

## 2019-09-11 NOTE — Telephone Encounter (Signed)
Virtual Visit Pre-Appointment Phone Call  "(Name), I am calling you today to discuss your upcoming appointment. We are currently trying to limit exposure to the virus that causes COVID-19 by seeing patients at home rather than in the office."  1. "What is the BEST phone number to call the day of the visit?" - include this in appointment notes  2. "Do you have or have access to (through a family member/friend) a smartphone with video capability that we can use for your visit?" a. If yes - list this number in appt notes as "cell" (if different from BEST phone #) and list the appointment type as a VIDEO visit in appointment notes b. If no - list the appointment type as a PHONE visit in appointment notes  3. Confirm consent - "In the setting of the current Covid19 crisis, you are scheduled for a (phone or video) visit with your provider on (date) at (time).  Just as we do with many in-office visits, in order for you to participate in this visit, we must obtain consent.  If you'd like, I can send this to your mychart (if signed up) or email for you to review.  Otherwise, I can obtain your verbal consent now.  All virtual visits are billed to your insurance company just like a normal visit would be.  By agreeing to a virtual visit, we'd like you to understand that the technology does not allow for your provider to perform an examination, and thus may limit your provider's ability to fully assess your condition. If your provider identifies any concerns that need to be evaluated in person, we will make arrangements to do so.  Finally, though the technology is pretty good, we cannot assure that it will always work on either your or our end, and in the setting of a video visit, we may have to convert it to a phone-only visit.  In either situation, we cannot ensure that we have a secure connection.  Are you willing to proceed?" STAFF: Did the patient verbally acknowledge consent to telehealth visit? Document  YES/NO here: yes  4. Advise patient to be prepared - "Two hours prior to your appointment, go ahead and check your blood pressure, pulse, oxygen saturation, and your weight (if you have the equipment to check those) and write them all down. When your visit starts, your provider will ask you for this information. If you have an Apple Watch or Kardia device, please plan to have heart rate information ready on the day of your appointment. Please have a pen and paper handy nearby the day of the visit as well."  5. Give patient instructions for MyChart download to smartphone OR [email or web address] as below if video visit (depending on what platform provider is using)  6. Inform patient they will receive a phone call 15 minutes prior to their appointment time (may be from unknown caller ID) so they should be prepared to answer    TELEPHONE CALL NOTE  Dana N Kinnard has been deemed a candidate for a follow-up tele-health visit to limit community exposure during the Covid-19 pandemic. I spoke with the patient via phone to ensure availability of phone/video source, confirm preferred email & phone number, and discuss instructions and expectations.  I reminded Hiroshi N Lovering to be prepared with any vital sign and/or heart rhythm information that could potentially be obtained via home monitoring, at the time of his visit. I reminded Zymir N Sieling to expect a phone call prior to  his visit.  Geraldine Contras 09/11/2019 2:28 PM   INSTRUCTIONS FOR DOWNLOADING THE MYCHART APP TO SMARTPHONE  - The patient must first make sure to have activated MyChart and know their login information - If Apple, go to [employer] and type in MyChart in the search bar and download the app. If Android, ask patient to go to [employer] and type in [place] in the search bar and download the app. The app is free but as with any other app downloads, their phone may require them to verify saved payment information or Apple/Android  password.  - The patient will need to then log into the app with their MyChart username and password, and select [facility] as their healthcare provider to link the account. When it is time for your visit, go to the MyChart app, find appointments, and click Begin Video Visit. Be sure to Select Allow for your device to access the Microphone and Camera for your visit. You will then be connected, and your provider will be with you shortly.  **If they have any issues connecting, or need assistance please contact MyChart service desk [phone])**  **If using a computer, in order to ensure the best quality for their visit they will need to use either of the following Internet Browsers: [employer] or Google Chrome**  IF USING DOXIMITY or DOXY.ME - The patient will receive a link just prior to their visit by text.     FULL LENGTH CONSENT FOR TELE-HEALTH VISIT   I hereby voluntarily request, consent and authorize [facility] and its employed or contracted physicians, physician assistants, nurse practitioners or other licensed health care professionals (the Practitioner), to provide me with telemedicine health care services (the "Services") as deemed necessary by the treating Practitioner. I acknowledge and consent to receive the Services by the Practitioner via telemedicine. I understand that the telemedicine visit will involve communicating with the Practitioner through live audiovisual communication technology and the disclosure of certain medical information by electronic transmission. I acknowledge that I have been given the opportunity to request an in-person assessment or other available alternative prior to the telemedicine visit and am voluntarily participating in the telemedicine visit.  I understand that I have the right to withhold or withdraw my consent to the use of telemedicine in the course of my care at any time, without affecting my right to future care or treatment,  and that the Practitioner or I may terminate the telemedicine visit at any time. I understand that I have the right to inspect all information obtained and/or recorded in the course of the telemedicine visit and may receive copies of available information for a reasonable fee.  I understand that some of the potential risks of receiving the Services via telemedicine include:  Marland Kitchen Delay or interruption in medical evaluation due to technological equipment failure or disruption; . Information transmitted may not be sufficient (e.g. poor resolution of images) to allow for appropriate medical decision making by the Practitioner; and/or  . In rare instances, security protocols could fail, causing a breach of personal health information.  Furthermore, I acknowledge that it is my responsibility to provide information about my medical history, conditions and care that is complete and accurate to the best of my ability. I acknowledge that Practitioner's advice, recommendations, and/or decision may be based on factors not within their control, such as incomplete or inaccurate data provided by me or distortions of diagnostic images or specimens that may result from electronic transmissions. I  understand that the practice of medicine is not an exact science and that Practitioner makes no warranties or guarantees regarding treatment outcomes. I acknowledge that I will receive a copy of this consent concurrently upon execution via email to the email address I last provided but may also request a printed copy by calling the office of [facility].    I understand that my insurance will be billed for this visit.   I have read or had this consent read to me. . I understand the contents of this consent, which adequately explains the benefits and risks of the Services being provided via telemedicine.  . I have been provided ample opportunity to ask questions regarding this consent and the Services and have had my questions  answered to my satisfaction. . I give my informed consent for the services to be provided through the use of telemedicine in my medical care  By participating in this telemedicine visit I agree to the above.

## 2019-09-15 ENCOUNTER — Telehealth (INDEPENDENT_AMBULATORY_CARE_PROVIDER_SITE_OTHER): Payer: Self-pay | Admitting: Cardiovascular Disease

## 2019-09-15 ENCOUNTER — Encounter: Payer: Self-pay | Admitting: Cardiovascular Disease

## 2019-09-15 VITALS — Ht 72.0 in | Wt 144.0 lb

## 2019-09-15 DIAGNOSIS — R079 Chest pain, unspecified: Secondary | ICD-10-CM

## 2019-09-15 DIAGNOSIS — I351 Nonrheumatic aortic (valve) insufficiency: Secondary | ICD-10-CM

## 2019-09-15 DIAGNOSIS — Z720 Tobacco use: Secondary | ICD-10-CM

## 2019-09-15 NOTE — Patient Instructions (Signed)
Your physician recommends that you schedule a follow-up appointment in: AS NEEDED WITH DR KONESWARAN  Your physician recommends that you continue on your current medications as directed. Please refer to the Current Medication list given to you today.  Thank you for choosing [facility]!!    

## 2019-09-15 NOTE — Progress Notes (Signed)
Virtual Visit via Telephone Note   This visit type was conducted due to national recommendations for restrictions regarding the COVID-19 Pandemic (e.g. social distancing) in an effort to limit this patient's exposure and mitigate transmission in our community.  Due to his co-morbid illnesses, this patient is at least at moderate risk for complications without adequate follow up.  This format is felt to be most appropriate for this patient at this time.  The patient did not have access to video technology/had technical difficulties with video requiring transitioning to audio format only (telephone).  All issues noted in this document were discussed and addressed.  No physical exam could be performed with this format.  Please refer to the patient's chart for his  consent to telehealth for [facility].   Date:  09/15/2019   ID:  Aaron Roman, DOB 1991-08-18, MRN [id number]  Patient Location: Home Provider Location: Office  PCP:  Patient, No Pcp Per  Cardiologist:  Prentice Docker, MD  Electrophysiologist:  None   Evaluation Performed:  Follow-Up Visit  Chief Complaint: Chest pain  History of Present Illness:    Aaron Roman is a 29 y.o. male with chest pain.  He was last evaluated by Randall An, PA-C on 05/07/2018.  He underwent a normal stress echocardiogram at that time.  He had mild aortic regurgitation by an echocardiogram in September 2016.  He has a history of tobacco use.  The patient denies any symptoms of chest pain, palpitations, shortness of breath, lightheadedness, dizziness, leg swelling, orthopnea, PND, and syncope.     Past Medical History:  Diagnosis Date  . Chronic abdominal pain   . Chronic diarrhea   . Cyclical vomiting with nausea   . GERD (gastroesophageal reflux disease)    Past Surgical History:  Procedure Laterality Date  . COLONOSCOPY    . COLONOSCOPY N/A 02/04/2015   Procedure: COLONOSCOPY;  Surgeon: Malissa Hippo, MD;  Location[facility];  Service: Endoscopy;  Laterality: N/A;  145 - moved to 7:30 - Ann notified pt  . ESOPHAGOGASTRODUODENOSCOPY N/A 04/14/2014   Procedure: ESOPHAGOGASTRODUODENOSCOPY (EGD);  Surgeon: Malissa Hippo, MD;  Location: [facility];  Service: Endoscopy;  Laterality: N/A;  830  . ESOPHAGOGASTRODUODENOSCOPY N/A 02/04/2015   Procedure: ESOPHAGOGASTRODUODENOSCOPY (EGD);  Surgeon: Malissa Hippo, MD;  Location: [facility];  Service: Endoscopy;  Laterality: N/A;  . WISDOM TOOTH EXTRACTION       Current Meds  Medication Sig  . cetirizine (ZYRTEC) 10 MG tablet Take 5 mg by mouth daily.  . hydrOXYzine (VISTARIL) 25 MG capsule Take 1 capsule by mouth at bedtime as needed.  . loratadine-pseudoephedrine (CLARITIN-D 12 HOUR) 5-120 MG tablet Take 1 tablet by mouth 2 (two) times daily.  . sertraline (ZOLOFT) 100 MG tablet Take 100 mg by mouth daily.  . traMADol (ULTRAM) 50 MG tablet 1 or 2 po q6h prn pain.     Allergies:   Codeine and Pollen extract   Social History   Tobacco Use  . Smoking status: Current Every Day Smoker    Packs/day: 1.00    Years: 10.00    Pack years: 10.00    Types: Cigarettes  . Smokeless tobacco: Never Used  Substance Use Topics  . Alcohol use: No    Alcohol/week: 0.0 standard drinks    Comment: 1 -2 beers per week  . Drug use: Not Currently    Types: Oxycodone, Hydrocodone, Marijuana    Comment: does not use  Family Hx: The patient's family history includes Diabetes in his maternal grandmother; Heart disease in his father; Hypertension in his maternal grandmother.  ROS:   Please see the history of present illness.     All other systems reviewed and are negative.   Prior CV studies:   The following studies were reviewed today:  Stress echocardiogram 05/14/2018:   - Stress ECG conclusions: [facility] scoring: exercise time of 9 min;   maximum ST deviation of 0 mm; no angina; resulting score is 9.   This score predicts a low risk of cardiac events. -  Staged echo: Normal echo stress  Impressions:  - Normal study after maximal exercise.  Labs/Other Tests and Data Reviewed:    EKG:  No ECG reviewed.  Recent Labs: No results found for requested labs within last 8760 hours.   Recent Lipid Panel No results found for: CHOL, TRIG, HDL, CHOLHDL, LDLCALC, LDLDIRECT  Wt Readings from Last 3 Encounters:  09/15/19 144 lb (65.3 kg)  05/07/18 138 lb (62.6 kg)  05/05/18 144 lb (65.3 kg)     Objective:    Vital Signs:  Ht 6' (1.829 m)   Wt 144 lb (65.3 kg)   BMI 19.53 kg/m    VITAL SIGNS:  reviewed  ASSESSMENT & PLAN:    1.  Chest pain: He has had no symptom recurrence.  He underwent a normal stress echocardiogram in 2019.  No further cardiac testing is indicated at this time.  2.  Aortic regurgitation: Mild in severity by echocardiogram in September 2016.  3.  Tobacco use: Cessation previously advised.   COVID-19 Education: The signs and symptoms of COVID-19 were discussed with the patient and how to seek care for testing (follow up with PCP or arrange E-visit).  The importance of social distancing was discussed today.  Time:   Today, I have spent 5 minutes with the patient with telehealth technology discussing the above problems.     Medication Adjustments/Labs and Tests Ordered: Current medicines are reviewed at length with the patient today.  Concerns regarding medicines are outlined above.   Tests Ordered: No orders of the defined types were placed in this encounter.   Medication Changes: No orders of the defined types were placed in this encounter.   Follow Up:  Virtual Visit  prn  Signed, Kate Sable, MD  09/15/2019 9:46 AM    [place]

## 2021-12-09 ENCOUNTER — Other Ambulatory Visit: Payer: Self-pay

## 2021-12-09 ENCOUNTER — Encounter (HOSPITAL_COMMUNITY): Payer: Self-pay | Admitting: Emergency Medicine

## 2021-12-09 ENCOUNTER — Emergency Department (HOSPITAL_COMMUNITY)
Admission: EM | Admit: 2021-12-09 | Discharge: 2021-12-09 | Disposition: A | Discharge status: Home / Self Care | Payer: Self-pay | Attending: Emergency Medicine | Admitting: Emergency Medicine

## 2021-12-09 DIAGNOSIS — N342 Other urethritis: Secondary | ICD-10-CM | POA: Insufficient documentation

## 2021-12-09 LAB — RPR: RPR Ser Ql: NONREACTIVE

## 2021-12-09 LAB — HIV ANTIBODY (ROUTINE TESTING W REFLEX): HIV Screen 4th Generation wRfx: REACTIVE — AB

## 2021-12-09 MED ORDER — DOXYCYCLINE HYCLATE 100 MG PO CAPS: 100.0000 mg | ORAL_CAPSULE | Freq: Two times a day (BID) | ORAL | 0 refills | Status: DC

## 2021-12-09 MED ORDER — DOXYCYCLINE HYCLATE 100 MG PO TABS
100.0000 mg | ORAL_TABLET | Freq: Once | ORAL | Status: AC
  Administered 2021-12-09: 100 mg via ORAL
  Filled 2021-12-09: qty 1

## 2021-12-09 MED ORDER — CEFTRIAXONE SODIUM 500 MG IJ SOLR
500.0000 mg | Freq: Once | INTRAMUSCULAR | Status: AC
  Administered 2021-12-09: 500 mg via INTRAMUSCULAR
  Filled 2021-12-09: qty 500

## 2021-12-09 MED ORDER — LIDOCAINE HCL (PF) 1 % IJ SOLN
INTRAMUSCULAR | Status: AC
  Administered 2021-12-09: 5 mL
  Filled 2021-12-09: qty 5

## 2021-12-09 NOTE — ED Triage Notes (Signed)
Pt with c/o painful urination and a white discharge from his penis. ?

## 2021-12-09 NOTE — ED Provider Notes (Signed)
?[facility] EMERGENCY DEPARTMENT ?Provider Note ? ? ?CSN: [id number] ?Arrival date & time: 12/09/21  0004 ? ?  ? ?History ? ?Chief Complaint  ?Patient presents with  ? painful urination  ? Penile Discharge  ? ? ?Aaron Roman is a 31 y.o. male. ? ?The history is provided by the patient.  ?Penile Discharge ?He has history of chronic abdominal pain and comes in complaining of urethral discharge with dysuria for the last 3 days.  Discharge is noted to be white.  He denies any abdominal pain or nausea and denies fever or chills.  He does admit to having had sexual contacts, but states that he used a condom and that there was no genital contact without a condom. ?  ?Home Medications ?Prior to Admission medications   ?Medication Sig Start Date End Date Taking? Authorizing Provider  ?cetirizine (ZYRTEC) 10 MG tablet Take 5 mg by mouth daily. 03/31/18   [provider]  ?hydrOXYzine (VISTARIL) 25 MG capsule Take 1 capsule by mouth at bedtime as needed. 03/31/18   [provider]  ?loratadine-pseudoephedrine (CLARITIN-D 12 HOUR) 5-120 MG tablet Take 1 tablet by mouth 2 (two) times daily. 05/05/18   Ivery Quale, PA-C  ?sertraline (ZOLOFT) 100 MG tablet Take 100 mg by mouth daily. 03/31/18   [provider]  ?traMADol (ULTRAM) 50 MG tablet 1 or 2 po q6h prn pain. 05/05/18   Ivery Quale, PA-C  ?   ? ?Allergies    ?Codeine and Pollen extract   ? ?Review of Systems   ?Review of Systems  ?Genitourinary:  Positive for penile discharge.  ?All other systems reviewed and are negative. ? ?Physical Exam ?Updated Vital Signs ?BP 116/81 (BP Location: Left Arm)   Pulse 86   Temp 98.1 ?F (36.7 ?C) (Oral)   Resp 16   Ht 6' (1.829 m)   Wt 63.5 kg   SpO2 98%   BMI 18.99 kg/m?  ?Physical Exam ?Vitals and nursing note reviewed.  ?31 year old male, resting comfortably and in no acute distress. Vital signs are normal. Oxygen saturation is 98%, which is normal. ?Head is normocephalic and atraumatic. PERRLA, EOMI.   ?Back is nontender and there is no CVA tenderness. ?Lungs are clear without rales, wheezes, or rhonchi. ?Chest is nontender. ?Heart has regular rate and rhythm without murmur. ?Abdomen is soft, flat, nontender. ?Genitalia: Circumcised penis.  Testes descended.  Clear urethral discharge noted.  No inguinal adenopathy detected. ?Extremities have no cyanosis or edema, full range of motion is present. ?Skin is warm and dry without rash. ?Neurologic: Mental status is normal, cranial nerves are intact, moves all extremities equally. ? ?ED Results / Procedures / Treatments   ?Labs ?(all labs ordered are listed, but only abnormal results are displayed) ?Labs Reviewed  ?RPR  ?HIV ANTIBODY (ROUTINE TESTING W REFLEX)  ?GC/CHLAMYDIA PROBE AMP ([place]) NOT AT [facility]  ? ? ?EKG ?None ? ?Radiology ?No results found. ? ?Procedures ?Procedures  ? ? ?Medications Ordered in ED ?Medications  ?cefTRIAXone (ROCEPHIN) injection 500 mg (has no administration in time range)  ?doxycycline (VIBRA-TABS) tablet 100 mg (has no administration in time range)  ? ? ?ED Course/ Medical Decision Making/ A&P ?  ?                        ?Medical Decision Making ?Amount and/or Complexity of Data Reviewed ?Labs: ordered. ? ?Risk ?Prescription drug management. ? ? ?Urethritis which is most likely chlamydia, possible gonorrhea.  STI panel was obtained and he is empirically treated with ceftriaxone and doxycycline. ? ?Final Clinical Impression(s) / ED Diagnoses ?Final diagnoses:  ?Urethritis  ? ? ?Rx / DC Orders ?ED Discharge Orders   ? ?      Ordered  ?  doxycycline (VIBRAMYCIN) 100 MG capsule  2 times daily       ? 12/09/21 0159  ? ?  ?  ? ?  ? ? ?  ?Dione Booze, MD ?12/09/21 0159 ? ?

## 2021-12-09 NOTE — Discharge Instructions (Addendum)
Make sure all sexual partners are treated. ?

## 2021-12-10 ENCOUNTER — Telehealth: Payer: Self-pay | Admitting: Infectious Disease

## 2021-12-10 NOTE — Telephone Encounter (Signed)
Another likely true positive, discrim assay not back yet ?

## 2021-12-11 ENCOUNTER — Telehealth: Payer: Self-pay | Admitting: Infectious Disease

## 2021-12-11 LAB — GC/CHLAMYDIA PROBE AMP (~~LOC~~) NOT AT ARMC
Chlamydia: NEGATIVE
Comment: NEGATIVE
Comment: NORMAL
Neisseria Gonorrhea: POSITIVE — AB

## 2021-12-11 LAB — HIV-1/2 AB - DIFFERENTIATION
HIV 1 Ab: REACTIVE
HIV 2 Ab: NONREACTIVE

## 2021-12-11 NOTE — Telephone Encounter (Signed)
Confirmatory testing (+). Referral faxed to [facility] for notification and linkage to care.  ? ?Beryle Flock, RN ? ?

## 2021-12-11 NOTE — Telephone Encounter (Signed)
Discriminatory assay back positive as well ?

## 2021-12-15 ENCOUNTER — Telehealth: Payer: Self-pay

## 2021-12-15 ENCOUNTER — Other Ambulatory Visit (HOSPITAL_COMMUNITY): Payer: Self-pay

## 2021-12-15 NOTE — Telephone Encounter (Signed)
[facility] Patient Advocate Encounter ? ?Insurance verification completed.   ? ?The patient is uninsured and will need patient assistance for medication. ? ?We can complete the application and will need to meet with the patient for signatures and income documentation. ? ?Tinika Bucknam, CPhT ?Specialty Pharmacy Patient Advocate ?[facility] ?Phone: [phone] ?Fax:  [phone]  ?

## 2021-12-15 NOTE — Telephone Encounter (Signed)
Spoke with Corene Cornea at [employer], he is going to attempt to contact patient for result notification. He will call back to get patient scheduled once he's made contact. ? ?Beryle Flock, RN ? ?

## 2021-12-20 ENCOUNTER — Other Ambulatory Visit: Payer: Self-pay

## 2021-12-20 ENCOUNTER — Ambulatory Visit (INDEPENDENT_AMBULATORY_CARE_PROVIDER_SITE_OTHER): Payer: Self-pay | Admitting: Pharmacist

## 2021-12-20 ENCOUNTER — Ambulatory Visit: Payer: Self-pay

## 2021-12-20 ENCOUNTER — Encounter: Payer: Self-pay | Admitting: Family

## 2021-12-20 ENCOUNTER — Ambulatory Visit (INDEPENDENT_AMBULATORY_CARE_PROVIDER_SITE_OTHER): Payer: Self-pay | Admitting: Family

## 2021-12-20 VITALS — BP 117/81 | HR 86 | Temp 99.3°F | Wt 150.0 lb

## 2021-12-20 DIAGNOSIS — Z79899 Other long term (current) drug therapy: Secondary | ICD-10-CM

## 2021-12-20 DIAGNOSIS — B20 Human immunodeficiency virus [HIV] disease: Secondary | ICD-10-CM | POA: Insufficient documentation

## 2021-12-20 DIAGNOSIS — Z113 Encounter for screening for infections with a predominantly sexual mode of transmission: Secondary | ICD-10-CM

## 2021-12-20 MED ORDER — BIKTARVY 50-200-25 MG PO TABS: 1.0000 | ORAL_TABLET | Freq: Every day | ORAL | 3 refills | Status: DC

## 2021-12-20 NOTE — Patient Instructions (Addendum)
Nice to see you.  We will check your lab work today.  Continue to take your medication daily as prescribed.  Medication will be sent to the pharmacy.   Plan for follow up in 1 months or sooner if needed with lab work on the same day.  Have a great day and stay safe!  

## 2021-12-20 NOTE — Progress Notes (Signed)
? ?12/20/2021 ? ?HPI: Aaron Roman is a 31 y.o. male who presents to the [facility] clinic today to initiate care for a newly diagnosed HIV infection. ? ?Patient Active Problem List  ? Diagnosis Date Noted  ? Chest pain 03/02/2015  ? Abdominal pain, epigastric 04/09/2014  ? Nausea and vomiting 04/09/2014  ? ? ?Patient's Medications  ?New Prescriptions  ? BICTEGRAVIR-EMTRICITABINE-TENOFOVIR AF (BIKTARVY) 50-200-25 MG TABS TABLET    Take 1 tablet by mouth daily.  ?Previous Medications  ? No medications on file  ?Modified Medications  ? No medications on file  ?Discontinued Medications  ? No medications on file  ? ? ?Allergies: ?Allergies  ?Allergen Reactions  ? Codeine Nausea And Vomiting  ? Pollen Extract Itching  ? ? ?Past Medical History: ?Past Medical History:  ?Diagnosis Date  ? Chronic abdominal pain   ? Chronic diarrhea   ? Cyclical vomiting with nausea   ? GERD (gastroesophageal reflux disease)   ? ? ?Social History: ?Social History  ? ?Socioeconomic History  ? Marital status: Single  ?  Spouse name: Not on file  ? Number of children: Not on file  ? Years of education: Not on file  ? Highest education level: Not on file  ?Occupational History  ? Not on file  ?Tobacco Use  ? Smoking status: Every Day  ?  Packs/day: 1.00  ?  Years: 10.00  ?  Pack years: 10.00  ?  Types: Cigarettes  ? Smokeless tobacco: Never  ?Vaping Use  ? Vaping Use: Never used  ?Substance and Sexual Activity  ? Alcohol use: Not Currently  ? Drug use: Not Currently  ?  Types: Oxycodone, Hydrocodone, Marijuana, Heroin  ?  Comment: History of heroin  ? Sexual activity: Not on file  ?Other Topics Concern  ? Not on file  ?Social History Narrative  ? Not on file  ? ?Social Determinants of Health  ? ?Financial Resource Strain: Not on file  ?Food Insecurity: Not on file  ?Transportation Needs: Not on file  ?Physical Activity: Not on file  ?Stress: Not on file  ?Social Connections: Not on file  ? ? ?Labs: ?No results found for: HIV1RNAQUANT, HIV1RNAVL,  CD4TABS ? ?RPR and STI ?Lab Results  ?Component Value Date  ? LABRPR NON REACTIVE 12/09/2021  ? ? ?STI Results GC CT  ?12/09/2021 ? 1:52 AM Positive   Negative    ?03/05/2012 ? 8:14 PM  NEGATIVE    ? ? ?Hepatitis B ?No results found for: HEPBSAB, HEPBSAG, HEPBCAB ?Hepatitis C ?No results found for: HEPCAB, HCVRNAPCRQN ?Hepatitis A ?No results found for: HAV ?Lipids: ?No results found for: CHOL, TRIG, HDL, CHOLHDL, VLDL, LDLCALC ? ?Current HIV Regimen: ?Treatment naive ? ?Assessment: ?[name] is here today to initiate care with our clinic for newly diagnosed HIV infection. [name] will start patient on Biktarvy. Will provide samples today as [name] is getting approved.  ? ?Counseled patient that Susanne Borders is a one pill once daily medication with or without food and the importance of not missing any doses. Explained that it is important to take Biktarvy daily and not skip days or doses to decrease likelihood of treatment failure. Counseled patient to take it around the same time each day. Counseled on what to do if dose is missed, if closer to missed dose take immediately, if closer to next dose then skip and resume normal schedule. Cautioned on possible side effects the first week or so including nausea, diarrhea, dizziness, and headaches but that they should  resolve after the first couple of weeks. Counseled patient to separate Biktarvy from divalent cations including multivitamins. Gave the patient Amanda's card and to call with any issues/questions/concerns.  ? ?Plan: ?- Provided Biktarvy samples (4 bottles of 7 tablets each) ?- Contact with issues/questions ? ?Gaye Alken, PharmD Candidate ?12/20/2021 3:05 PM ? ? ? ? ? ?

## 2021-12-20 NOTE — Progress Notes (Signed)
? ? ?Brief Narrative  ? ?Patient ID: Aaron Roman, male    DOB: 13-Oct-1990, 31 y.o.   MRN: [id number] ? ?Aaron Roman is a 31 y/o AA male diagnosed with HIV disease on 12/09/21 with risk factor of history of drug use. Initial lab work pending. No history of opportunistic infection. Treatment naive on entry to care and started on Biktarvy.  ? ?Subjective:  ?  ?Chief Complaint  ?Patient presents with  ? Follow-up  ? ? ?HPI: ? ?Aaron Roman is a 31 y.o. male with previous medical history of chronic abdominal pain, diarrhea, and GERD presenting today for initial evaluation and treatment of HIV disease.  ? ?Aaron Roman was seen at the [facility] ED on 12/09/21 with dysuria and penile discharge. Urine was found to be positive for gonorrhea which was treated with ceftriaxone. RPR was negative and HIV testing was positive with confirmatory antibody testing. Does not recall last negative HIV testing. Risk factor is heroin usage. Overall feeling well today with no concerns/complaints. Denies fevers, chills, night sweats, headaches, changes in vision, neck pain/stiffness, nausea, diarrhea, vomiting, lesions or rashes. Aaron Roman has stable housing and good access to food and other resources. Has applied for RW and UMAP.  ? ? ?Allergies  ?Allergen Reactions  ? Codeine Nausea And Vomiting  ? Pollen Extract Itching  ? ? ? ? ?Outpatient Medications Prior to Visit  ?Medication Sig Dispense Refill  ? doxycycline (VIBRAMYCIN) 100 MG capsule Take 1 capsule (100 mg total) by mouth 2 (two) times daily. One po bid x 7 days 14 capsule 0  ? hydrOXYzine (VISTARIL) 25 MG capsule Take 1 capsule by mouth at bedtime as needed.  5  ? loratadine-pseudoephedrine (CLARITIN-D 12 HOUR) 5-120 MG tablet Take 1 tablet by mouth 2 (two) times daily. 20 tablet 0  ? sertraline (ZOLOFT) 100 MG tablet Take 100 mg by mouth daily.  5  ? traMADol (ULTRAM) 50 MG tablet 1 or 2 po q6h prn pain. 12 tablet 0  ? cetirizine (ZYRTEC) 10 MG tablet Take 5 mg by mouth daily. (Patient  not taking: Reported on 12/20/2021)  5  ? ?No facility-administered medications prior to visit.  ? ? ? ?Past Medical History:  ?Diagnosis Date  ? Chronic abdominal pain   ? Chronic diarrhea   ? Cyclical vomiting with nausea   ? GERD (gastroesophageal reflux disease)   ? ? ? ?Past Surgical History:  ?Procedure Laterality Date  ? COLONOSCOPY    ? COLONOSCOPY N/A 02/04/2015  ? Procedure: COLONOSCOPY;  Surgeon: Rogene Houston, MD;  [facility];  Service: Endoscopy;  Laterality: N/A;  145 - moved to 7:30 - Ann notified pt  ? ESOPHAGOGASTRODUODENOSCOPY N/A 04/14/2014  ? Procedure: ESOPHAGOGASTRODUODENOSCOPY (EGD);  Surgeon: Rogene Houston, MD;  [facility];  Service: Endoscopy;  Laterality: N/A;  830  ? ESOPHAGOGASTRODUODENOSCOPY N/A 02/04/2015  ? Procedure: ESOPHAGOGASTRODUODENOSCOPY (EGD);  Surgeon: Rogene Houston, MD;  [facility];  Service: Endoscopy;  Laterality: N/A;  ? WISDOM TOOTH EXTRACTION    ? ? ? ? ?Review of Systems  ?Constitutional:  Negative for appetite change, chills, fatigue, fever and unexpected weight change.  ?Eyes:  Negative for visual disturbance.  ?Respiratory:  Negative for cough, chest tightness, shortness of breath and wheezing.   ?Cardiovascular:  Negative for chest pain and leg swelling.  ?Gastrointestinal:  Negative for abdominal pain, constipation, diarrhea, nausea and vomiting.  ?Genitourinary:  Negative for dysuria, flank pain, frequency, genital sores,  hematuria and urgency.  ?Skin:  Negative for rash.  ?Allergic/Immunologic: Negative for immunocompromised state.  ?Neurological:  Negative for dizziness and headaches.  ?   ?Objective:  ?  ?BP 117/81   Pulse 86   Temp 99.3 ?F (37.4 ?C) (Oral)   Wt 150 lb (68 kg)   SpO2 96%   BMI 20.34 kg/m?  ?Nursing note and vital signs reviewed. ? ?Physical Exam ?Constitutional:   ?   General: He is not in acute distress. ?   Appearance: He is well-developed.  ?Eyes:  ?   Conjunctiva/sclera: Conjunctivae normal.   ?Cardiovascular:  ?   Rate and Rhythm: Normal rate and regular rhythm.  ?   Heart sounds: Normal heart sounds. No murmur heard. ?  No friction rub. No gallop.  ?Pulmonary:  ?   Effort: Pulmonary effort is normal. No respiratory distress.  ?   Breath sounds: Normal breath sounds. No wheezing or rales.  ?Chest:  ?   Chest wall: No tenderness.  ?Abdominal:  ?   General: Bowel sounds are normal.  ?   Palpations: Abdomen is soft.  ?   Tenderness: There is no abdominal tenderness.  ?Musculoskeletal:  ?   Cervical back: Neck supple.  ?Lymphadenopathy:  ?   Cervical: No cervical adenopathy.  ?Skin: ?   General: Skin is warm and dry.  ?   Findings: No rash.  ?Neurological:  ?   Mental Status: He is alert and oriented to person, place, and time.  ?Psychiatric:     ?   Behavior: Behavior normal.     ?   Thought Content: Thought content normal.     ?   Judgment: Judgment normal.  ? ? ? ?   ? View : No data to display.  ?  ?  ?  ?  ?   ?Assessment & Plan:  ? ? ?Patient Active Problem List  ? Diagnosis Date Noted  ? HIV disease ([place]) 12/20/2021  ? Chest pain 03/02/2015  ? Abdominal pain, epigastric 04/09/2014  ? Nausea and vomiting 04/09/2014  ? ? ? ?Problem List Items Addressed This Visit   ? ?  ? Other  ? HIV disease ([place])  ?  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 No signs/symptoms of opportunistic infection. Reviewed the basics of HIV care including transmission, pathogenesis, risks if left untreated, treatment options, and financial resources. Will check clinic entry lab work today and start [employer]. Samples provided and recorded in pharmacy log. UMAP pending and has been approved for RW. Provided clinical orientation and he met with pharmacy staff. Plan for follow up in 1 month or sooner if needed.  ? ?  ?  ? Relevant Medications  ? bictegravir-emtricitabine-tenofovir AF (BIKTARVY) 50-200-25 MG TABS tablet  ? Other Relevant Orders  ? CBC WITH DIFFERENTIAL/PLATELET  ? COMPLETE  METABOLIC PANEL WITH GFR  ? HEPATITIS B SURFACE ANTIGEN  ? HEPATITIS B SURFACE ANTIBODY  ? HEPATITIS B CORE ANTIBODY, TOTAL  ? HEPATITIS C ANTIBODY  ? HEPATITIS A ANTIBODY, TOTAL  ? HLA [id number]  ? QuantiFERON-TB Gold Plus  ? T-helper cell (CD4)- ([facility] clinic only)  ? HIV RNA, RTPCR W/R GT (RTI, PI,INT)  ? ?Other Visit Diagnoses   ? ? Screening for STDs (sexually transmitted diseases)    -  Primary  ? Pharmacologic therapy      ? Relevant Orders  ? LIPID PANEL  ? ?  ? ? ? ?I have discontinued  Norrin N. Piche's hydrOXYzine, sertraline, cetirizine, traMADol, loratadine-pseudoephedrine, and doxycycline. I am also having him start on Biktarvy. ? ? ?Meds ordered this encounter  ?Medications  ? bictegravir-emtricitabine-tenofovir AF (BIKTARVY) 50-200-25 MG TABS tablet  ?  Sig: Take 1 tablet by mouth daily.  ?  Dispense:  30 tablet  ?  Refill:  3  ?  Awaiting UMAP. Please mail once approved.  ?  Order Specific Question:   Supervising Provider  ?  Answer:   Carlyle Basques [[id number]]  ? ? ? ?Follow-up: Return in about 1 month (around 01/19/2022), or if symptoms worsen or fail to improve. ? ? ?Terri Piedra, MSN, FNP-C ?Nurse Practitioner ?[facility] ?[facility] ?[facility] Main number: [phone] ? ? ?

## 2021-12-20 NOTE — Assessment & Plan Note (Signed)
Mr. 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 No signs/symptoms of opportunistic infection. Reviewed the basics of HIV care including transmission, pathogenesis, risks if left untreated, treatment options, and financial resources. Will check clinic entry lab work today and start [employer]. Samples provided and recorded in pharmacy log. UMAP pending and has been approved for RW. Provided clinical orientation and he met with pharmacy staff. Plan for follow up in 1 month or sooner if needed.  ?

## 2021-12-21 ENCOUNTER — Other Ambulatory Visit: Payer: Self-pay | Admitting: Pharmacist

## 2021-12-21 DIAGNOSIS — B20 Human immunodeficiency virus [HIV] disease: Secondary | ICD-10-CM

## 2021-12-21 MED ORDER — BICTEGRAVIR-EMTRICITAB-TENOFOV 50-200-25 MG PO TABS: 1.0000 | ORAL_TABLET | Freq: Every day | ORAL | 0 refills | Status: DC

## 2021-12-21 NOTE — Progress Notes (Signed)
Medication Samples have been provided to the patient. ? ?Drug name: Biktarvy        ?Strength: 50/200/25 mg       ?Qty: 28 tablets (4 bottles) ?LOT: [id number]   ?Exp.Date: 10/24 ? ?Dosing instructions: Take one tablet by mouth once daily ? ?The patient has been instructed regarding the correct time, dose, and frequency of taking this medication, including desired effects and most common side effects.  ? ?Aprill Banko, PharmD, CPP ?Clinical Pharmacist Practitioner ?Infectious Diseases Clinical Pharmacist ?[facility] ? ?

## 2021-12-23 LAB — HELPER T-LYMPH-CD4 (ARMC ONLY)
% CD 4 Pos. Lymph.: 26.7 % — ABNORMAL LOW (ref 30.8–58.5)
Absolute CD 4 Helper: 561 /uL (ref 359–1519)
Basophils Absolute: 0 10*3/uL (ref 0.0–0.2)
Basos: 1 %
EOS (ABSOLUTE): 0.6 10*3/uL — ABNORMAL HIGH (ref 0.0–0.4)
Eos: 13 %
Hematocrit: 46.3 % (ref 37.5–51.0)
Hemoglobin: 14.7 g/dL (ref 13.0–17.7)
Immature Grans (Abs): 0 10*3/uL (ref 0.0–0.1)
Immature Granulocytes: 0 %
Lymphocytes Absolute: 2.1 10*3/uL (ref 0.7–3.1)
Lymphs: 48 %
MCH: 25.7 pg — ABNORMAL LOW (ref 26.6–33.0)
MCHC: 31.7 g/dL (ref 31.5–35.7)
MCV: 81 fL (ref 79–97)
Monocytes Absolute: 0.3 10*3/uL (ref 0.1–0.9)
Monocytes: 6 %
Neutrophils Absolute: 1.4 10*3/uL (ref 1.4–7.0)
Neutrophils: 32 %
Platelets: 336 10*3/uL (ref 150–450)
RBC: 5.72 x10E6/uL (ref 4.14–5.80)
RDW: 14.1 % (ref 11.6–15.4)
WBC: 4.4 10*3/uL (ref 3.4–10.8)

## 2021-12-26 LAB — T-HELPER CELL (CD4) - (RCID CLINIC ONLY)

## 2022-01-02 ENCOUNTER — Encounter: Payer: Self-pay | Admitting: Family

## 2022-01-10 ENCOUNTER — Telehealth: Payer: Self-pay

## 2022-01-10 NOTE — Telephone Encounter (Signed)
Call taken by reception staff. Per [place], patient states he never received his Biktarvy and only has a couple tablets remaining from the samples he was given. ? ?Patient has an appointment scheduled with Marcos Eke on 01/15/22. ? ?Wyvonne Lenz, RN  ?

## 2022-01-12 LAB — LIPID PANEL
Cholesterol: 122 mg/dL (ref <200)
HDL: 23 mg/dL — ABNORMAL LOW (ref >=40)
LDL Cholesterol (Calc): 71 mg/dL (calc)
Non-HDL Cholesterol (Calc): 99 mg/dL (calc) (ref <130)
Total CHOL/HDL Ratio: 5.3 (calc) — ABNORMAL HIGH (ref <5.0)
Triglycerides: 216 mg/dL — ABNORMAL HIGH (ref <150)

## 2022-01-12 LAB — COMPLETE METABOLIC PANEL WITH GFR
AG Ratio: 1.4 (calc) (ref 1.0–2.5)
ALT: 9 U/L (ref 9–46)
AST: 14 U/L (ref 10–40)
Albumin: 4.4 g/dL (ref 3.6–5.1)
Alkaline phosphatase (APISO): 92 U/L (ref 36–130)
BUN/Creatinine Ratio: 7 (calc) (ref 6–22)
BUN: 6 mg/dL — ABNORMAL LOW (ref 7–25)
CO2: 30 mmol/L (ref 20–32)
Calcium: 9 mg/dL (ref 8.6–10.3)
Chloride: 102 mmol/L (ref 98–110)
Creat: 0.9 mg/dL (ref 0.60–1.26)
Globulin: 3.1 g/dL (calc) (ref 1.9–3.7)
Glucose, Bld: 91 mg/dL (ref 65–99)
Potassium: 4.2 mmol/L (ref 3.5–5.3)
Sodium: 138 mmol/L (ref 135–146)
Total Bilirubin: 0.3 mg/dL (ref 0.2–1.2)
Total Protein: 7.5 g/dL (ref 6.1–8.1)
eGFR: 116 mL/min/{1.73_m2} (ref >=60)

## 2022-01-12 LAB — HEPATITIS C ANTIBODY
Hepatitis C Ab: NONREACTIVE
SIGNAL TO CUT-OFF: 0.11 (ref <1.00)

## 2022-01-12 LAB — HEPATITIS B SURFACE ANTIBODY,QUALITATIVE: Hep B S Ab: REACTIVE — AB

## 2022-01-12 LAB — CBC WITH DIFFERENTIAL/PLATELET
Absolute Monocytes: 232 cells/uL (ref 200–950)
Basophils Absolute: 30 cells/uL (ref 0–200)
Basophils Relative: 0.7 %
Eosinophils Absolute: 503 cells/uL — ABNORMAL HIGH (ref 15–500)
Eosinophils Relative: 11.7 %
HCT: 43.7 % (ref 38.5–50.0)
Hemoglobin: 14.4 g/dL (ref 13.2–17.1)
Lymphs Abs: 2107 cells/uL (ref 850–3900)
MCH: 25.8 pg — ABNORMAL LOW (ref 27.0–33.0)
MCHC: 33 g/dL (ref 32.0–36.0)
MCV: 78.2 fL — ABNORMAL LOW (ref 80.0–100.0)
MPV: 9.2 fL (ref 7.5–12.5)
Monocytes Relative: 5.4 %
Neutro Abs: 1428 cells/uL — ABNORMAL LOW (ref 1500–7800)
Neutrophils Relative %: 33.2 %
Platelets: 327 10*3/uL (ref 140–400)
RBC: 5.59 10*6/uL (ref 4.20–5.80)
RDW: 13.6 % (ref 11.0–15.0)
Total Lymphocyte: 49 %
WBC: 4.3 10*3/uL (ref 3.8–10.8)

## 2022-01-12 LAB — HIV-1 INTEGRASE GENOTYPE

## 2022-01-12 LAB — HEPATITIS B SURFACE ANTIGEN: Hepatitis B Surface Ag: NONREACTIVE

## 2022-01-12 LAB — HLA B*5701: HLA-B*5701 w/rflx HLA-B High: NEGATIVE

## 2022-01-12 LAB — QUANTIFERON-TB GOLD PLUS
Mitogen-NIL: >10 IU/mL
NIL: 0.04 IU/mL
QuantiFERON-TB Gold Plus: NEGATIVE
TB1-NIL: <0 IU/mL
TB2-NIL: <0 IU/mL

## 2022-01-12 LAB — HEPATITIS B CORE ANTIBODY, TOTAL: Hep B Core Total Ab: NONREACTIVE

## 2022-01-12 LAB — HIV RNA, RTPCR W/R GT (RTI, PI,INT)
HIV 1 RNA Quant: 13600 copies/mL — ABNORMAL HIGH
HIV-1 RNA Quant, Log: 4.13 Log copies/mL — ABNORMAL HIGH

## 2022-01-12 LAB — HIV-1 GENOTYPE: HIV-1 Genotype: DETECTED — AB

## 2022-01-12 LAB — HEPATITIS A ANTIBODY, TOTAL: Hepatitis A AB,Total: NONREACTIVE

## 2022-01-12 NOTE — Telephone Encounter (Signed)
Per Jennette Kettle, Financial Counselor patient approved for financial assistance. Will need to call pharmacy for refill. ?Mychart message sent with update. ?Juanita Laster, RMA  ?

## 2022-01-15 ENCOUNTER — Other Ambulatory Visit: Payer: Self-pay

## 2022-01-15 ENCOUNTER — Ambulatory Visit (INDEPENDENT_AMBULATORY_CARE_PROVIDER_SITE_OTHER): Payer: Self-pay | Admitting: Family

## 2022-01-15 ENCOUNTER — Encounter: Payer: Self-pay | Admitting: Family

## 2022-01-15 VITALS — BP 133/92 | HR 71 | Temp 98.1°F | Wt 158.0 lb

## 2022-01-15 DIAGNOSIS — B20 Human immunodeficiency virus [HIV] disease: Secondary | ICD-10-CM

## 2022-01-15 MED ORDER — BIKTARVY 50-200-25 MG PO TABS: 1.0000 | ORAL_TABLET | Freq: Every day | ORAL | 3 refills | Status: DC

## 2022-01-15 NOTE — Progress Notes (Signed)
? ? ?Brief Narrative  ? ?Patient ID: Aaron Roman, male    DOB: [birth year], 31 y.o.   MRN: [id number] ? ?Aaron Roman is a 31 y/o AA male diagnosed with HIV disease on 12/09/21 with risk factor of history of drug use. Initial viral load 13,600 and CD4 count 561. Genotype with no significant medication resistant mutations. [id number] negative. No history of opportunistic infection. Enters care at [facility] Stage 1. Initial medication therapy with Biktarvy.  ? ?Subjective:  ?  ?Chief Complaint  ?Patient presents with  ? Follow-up  ? ? ?HPI: ? ?Aaron Roman is a 31 y.o. male with HIV disease last seen on 12/20/21 to establish care for newly diagnosed HIV disease. Initial viral load 13,600 with CD4 count 561. Genotype with no significant medication resistant mutations. [id number] and Quantiferon Gold negative. Immune to Hepatitis B. Not immune to Hepatitis A and no infection with Hepatitis C. Lipid profile with triglycerides 216, LDL 71 and HDL 23. Here today for first month follow up.  ? ?Aaron Roman has been taking his Biktarvy daily with no adverse side effects. Feeling well today with concerns about how to get refills of medication. Denies fevers, chills, night sweats, headaches, changes in vision, neck pain/stiffness, nausea, diarrhea, vomiting, lesions or rashes. ? ?Aaron Roman denies feelings of being down, depressed or hopeless. No current recreational or illicit drug use or alcohol consumption. Daily tobacco use. Condoms offered.  ? ? ?Allergies  ?Allergen Reactions  ? Codeine Nausea And Vomiting  ? Pollen Extract Itching  ? ? ? ? ?Outpatient Medications Prior to Visit  ?Medication Sig Dispense Refill  ? bictegravir-emtricitabine-tenofovir AF (BIKTARVY) 50-200-25 MG TABS tablet Take 1 tablet by mouth daily. 30 tablet 3  ? bictegravir-emtricitabine-tenofovir AF (BIKTARVY) 50-200-25 MG TABS tablet Take 1 tablet by mouth daily for 28 days. 28 tablet 0  ? ?No facility-administered medications prior to visit.  ? ? ? ?Past Medical History:   ?Diagnosis Date  ? Chronic abdominal pain   ? Chronic diarrhea   ? Cyclical vomiting with nausea   ? GERD (gastroesophageal reflux disease)   ? ? ? ?Past Surgical History:  ?Procedure Laterality Date  ? COLONOSCOPY    ? COLONOSCOPY N/A 02/04/2015  ? Procedure: COLONOSCOPY;  Surgeon: Malissa Hippo, MD;  [facility];  Service: Endoscopy;  Laterality: N/A;  145 - moved to 7:30 - Ann notified pt  ? ESOPHAGOGASTRODUODENOSCOPY N/A 04/14/2014  ? Procedure: ESOPHAGOGASTRODUODENOSCOPY (EGD);  Surgeon: Malissa Hippo, MD;  [facility];  Service: Endoscopy;  Laterality: N/A;  830  ? ESOPHAGOGASTRODUODENOSCOPY N/A 02/04/2015  ? Procedure: ESOPHAGOGASTRODUODENOSCOPY (EGD);  Surgeon: Malissa Hippo, MD;  [facility];  Service: Endoscopy;  Laterality: N/A;  ? WISDOM TOOTH EXTRACTION    ? ? ? ? ?Review of Systems  ?Constitutional:  Negative for appetite change, chills, fatigue, fever and unexpected weight change.  ?Eyes:  Negative for visual disturbance.  ?Respiratory:  Negative for cough, chest tightness, shortness of breath and wheezing.   ?Cardiovascular:  Negative for chest pain and leg swelling.  ?Gastrointestinal:  Negative for abdominal pain, constipation, diarrhea, nausea and vomiting.  ?Genitourinary:  Negative for dysuria, flank pain, frequency, genital sores, hematuria and urgency.  ?Skin:  Negative for rash.  ?Allergic/Immunologic: Negative for immunocompromised state.  ?Neurological:  Negative for dizziness and headaches.  ?   ?Objective:  ?  ?BP (!) 133/92   Pulse 71   Temp 98.1 ?F (36.7 ?C) (Temporal)   Wt  158 lb (71.7 kg)   BMI 21.43 kg/m?  ?Nursing note and vital signs reviewed. ? ?Physical Exam ?Constitutional:   ?   General: He is not in acute distress. ?   Appearance: He is well-developed.  ?Eyes:  ?   Conjunctiva/sclera: Conjunctivae normal.  ?Cardiovascular:  ?   Rate and Rhythm: Normal rate and regular rhythm.  ?   Heart sounds: Normal heart sounds. No murmur heard. ?   No friction rub. No gallop.  ?Pulmonary:  ?   Effort: Pulmonary effort is normal. No respiratory distress.  ?   Breath sounds: Normal breath sounds. No wheezing or rales.  ?Chest:  ?   Chest wall: No tenderness.  ?Abdominal:  ?   General: Bowel sounds are normal.  ?   Palpations: Abdomen is soft.  ?   Tenderness: There is no abdominal tenderness.  ?Musculoskeletal:  ?   Cervical back: Neck supple.  ?Lymphadenopathy:  ?   Cervical: No cervical adenopathy.  ?Skin: ?   General: Skin is warm and dry.  ?   Findings: No rash.  ?Neurological:  ?   Mental Status: He is alert and oriented to person, place, and time.  ?Psychiatric:     ?   Behavior: Behavior normal.     ?   Thought Content: Thought content normal.     ?   Judgment: Judgment normal.  ? ? ? ? ?  01/15/2022  ? 10:51 AM  ?Depression screen PHQ 2/9  ?Decreased Interest 0  ?Down, Depressed, Hopeless 0  ?PHQ - 2 Score 0  ?  ?   ?Assessment & Plan:  ? ? ?Patient Active Problem List  ? Diagnosis Date Noted  ? HIV disease (HCC) 12/20/2021  ? Chest pain 03/02/2015  ? Abdominal pain, epigastric 04/09/2014  ? Nausea and vomiting 04/09/2014  ? ? ? ?Problem List Items Addressed This Visit   ? ?  ? Other  ? HIV disease (HCC) - Primary  ?  Aaron Roman has done well following 1 month of Biktarvy with no adverse side effects. Discussed U=U, how to get medications from the pharmacy and plan of care. Continue current dose of Biktarvy. Check HIV RNA level. Plan for follow up in 2 months or sooner if needed with lab work on the same day.  ? ?  ?  ? Relevant Medications  ? bictegravir-emtricitabine-tenofovir AF (BIKTARVY) 50-200-25 MG TABS tablet  ? Other Relevant Orders  ? HIV-1 RNA quant-no reflex-bld  ? ? ? ?I have discontinued [place] N. Blandon's bictegravir-emtricitabine-tenofovir AF. I am also having him maintain his Biktarvy. ? ? ?Meds ordered this encounter  ?Medications  ? bictegravir-emtricitabine-tenofovir AF (BIKTARVY) 50-200-25 MG TABS tablet  ?  Sig: Take 1 tablet by mouth  daily.  ?  Dispense:  30 tablet  ?  Refill:  3  ?  Order Specific Question:   Supervising Provider  ?  Answer:   Judyann Munson [[id number]]  ? ? ? ?Follow-up: Return in about 2 months (around 03/17/2022), or if symptoms worsen or fail to improve. ? ? ?Marcos Eke, MSN, FNP-C ?Nurse Practitioner ?[facility] ?[facility] ?[facility] Main number: [phone] ? ? ?

## 2022-01-15 NOTE — Patient Instructions (Addendum)
Nice to see you.  We will check your lab work today.  Continue to take your medication daily as prescribed.  Refills have been sent to the pharmacy.  Plan for follow up in 2 months or sooner if needed with lab work on the same day.  Have a great day and stay safe!  

## 2022-01-15 NOTE — Assessment & Plan Note (Signed)
Aaron Roman has done well following 1 month of Biktarvy with no adverse side effects. Discussed U=U, how to get medications from the pharmacy and plan of care. Continue current dose of Biktarvy. Check HIV RNA level. Plan for follow up in 2 months or sooner if needed with lab work on the same day.  ?

## 2022-01-17 LAB — HIV-1 RNA QUANT-NO REFLEX-BLD
HIV 1 RNA Quant: <20 copies/mL — AB
HIV-1 RNA Quant, Log: <1.3 Log copies/mL — AB

## 2022-03-15 ENCOUNTER — Encounter: Payer: Self-pay | Admitting: Family

## 2022-03-15 ENCOUNTER — Other Ambulatory Visit: Payer: Self-pay

## 2022-03-15 ENCOUNTER — Ambulatory Visit (INDEPENDENT_AMBULATORY_CARE_PROVIDER_SITE_OTHER): Payer: Self-pay | Admitting: Family

## 2022-03-15 ENCOUNTER — Ambulatory Visit: Payer: Self-pay

## 2022-03-15 VITALS — BP 132/84 | HR 73 | Temp 97.8°F | Wt 171.0 lb

## 2022-03-15 DIAGNOSIS — Z0000 Encounter for general adult medical examination without abnormal findings: Secondary | ICD-10-CM

## 2022-03-15 DIAGNOSIS — B20 Human immunodeficiency virus [HIV] disease: Secondary | ICD-10-CM

## 2022-03-15 DIAGNOSIS — Z23 Encounter for immunization: Secondary | ICD-10-CM

## 2022-03-15 MED ORDER — BIKTARVY 50-200-25 MG PO TABS: 1.0000 | ORAL_TABLET | Freq: Every day | ORAL | 3 refills | Status: DC

## 2022-03-15 NOTE — Assessment & Plan Note (Addendum)
Aaron Roman continues to have well-controlled virus with good adherence and tolerance to his ART regimen of Biktarvy.  Does have some weight gain which we will continue to monitor.  Check lab work today.  Continue current dose of Biktarvy. Renew financial assistance. Plan for follow-up in 3 months or sooner if needed with lab work on the same day.

## 2022-03-15 NOTE — Assessment & Plan Note (Signed)
   Discussed importance of safe sexual practices and condom use.  Condoms offered.  Discussed family-planning and answered questions.  Prevnar 20 updated.

## 2022-03-15 NOTE — Patient Instructions (Addendum)
Nice to see you.  We will check your lab work today.  Continue to take your medication daily as prescribed.  Refills have been sent to the pharmacy.  Plan for follow up in 3 months or sooner if needed with lab work on the same day.  Have a great day and stay safe!  

## 2022-03-15 NOTE — Progress Notes (Signed)
Brief Narrative   Patient ID: Aaron Roman, male    DOB: Jan 25, 1991, 31 y.o.   MRN: [id number]  Aaron Roman is a 31 y/o AA male diagnosed with HIV disease on 12/09/21 with risk factor of history of drug use. Initial viral load 13,600 and CD4 count 561. Genotype with no significant medication resistant mutations. [id number] negative. No history of opportunistic infection. Enters care at [facility] Stage 1. Initial medication therapy with Biktarvy.   Subjective:    Chief Complaint  Patient presents with   Follow-up    HPI:  Aaron Roman is a 31 y.o. male with HIV disease last seen on 01/15/2022 with well-controlled virus and good adherence and tolerance to [employer].  Viral load was undetectable.  Here today for routine follow-up.  Aaron Roman continues to take his Biktarvy daily as prescribed with no adverse side effects.  Has noted increased weight gain since starting medication.  Continues to eat healthy and exercise. Denies fevers, chills, night sweats, headaches, changes in vision, neck pain/stiffness, nausea, diarrhea, vomiting, lesions or rashes.  Aaron Roman has no problems obtaining medication from the pharmacy and will renew financial assistance today.  Denies feelings of being down, depressed, or hopeless recently.  No current recreational or illicit drug use or alcohol consumption.  Smokes approximately 1 pack of cigarettes per day on average.  Condoms offered.  Healthcare maintenance due includes meningococcal and pneumococcal vaccinations.   Allergies  Allergen Reactions   Codeine Nausea And Vomiting   Pollen Extract Itching      Outpatient Medications Prior to Visit  Medication Sig Dispense Refill   bictegravir-emtricitabine-tenofovir AF (BIKTARVY) 50-200-25 MG TABS tablet Take 1 tablet by mouth daily. 30 tablet 3   No facility-administered medications prior to visit.     Past Medical History:  Diagnosis Date   Chronic abdominal pain    Chronic diarrhea    Cyclical vomiting with  nausea    GERD (gastroesophageal reflux disease)      Past Surgical History:  Procedure Laterality Date   COLONOSCOPY     COLONOSCOPY N/A 02/04/2015   Procedure: COLONOSCOPY;  Surgeon: Malissa Hippo, MD;  [facility];  Service: Endoscopy;  Laterality: N/A;  145 - moved to 7:30 - Ann notified pt   ESOPHAGOGASTRODUODENOSCOPY N/A 04/14/2014   Procedure: ESOPHAGOGASTRODUODENOSCOPY (EGD);  Surgeon: Malissa Hippo, MD;  [facility];  Service: Endoscopy;  Laterality: N/A;  830   ESOPHAGOGASTRODUODENOSCOPY N/A 02/04/2015   Procedure: ESOPHAGOGASTRODUODENOSCOPY (EGD);  Surgeon: Malissa Hippo, MD;  [facility];  Service: Endoscopy;  Laterality: N/A;   WISDOM TOOTH EXTRACTION        Review of Systems  Constitutional:  Negative for appetite change, chills, fatigue, fever and unexpected weight change.  Eyes:  Negative for visual disturbance.  Respiratory:  Negative for cough, chest tightness, shortness of breath and wheezing.   Cardiovascular:  Negative for chest pain and leg swelling.  Gastrointestinal:  Negative for abdominal pain, constipation, diarrhea, nausea and vomiting.  Genitourinary:  Negative for dysuria, flank pain, frequency, genital sores, hematuria and urgency.  Skin:  Negative for rash.  Allergic/Immunologic: Negative for immunocompromised state.  Neurological:  Negative for dizziness and headaches.      Objective:    BP 132/84   Pulse 73   Temp 97.8 F (36.6 C) (Temporal)   Wt 171 lb (77.6 kg)   SpO2 99%   BMI 23.19 kg/m  Nursing note and vital signs reviewed.  Physical Exam Constitutional:      General: He is not in acute distress.    Appearance: He is well-developed.  Eyes:     Conjunctiva/sclera: Conjunctivae normal.  Cardiovascular:     Rate and Rhythm: Normal rate and regular rhythm.     Heart sounds: Normal heart sounds. No murmur heard.    No friction rub. No gallop.  Pulmonary:     Effort: Pulmonary effort is  normal. No respiratory distress.     Breath sounds: Normal breath sounds. No wheezing or rales.  Chest:     Chest wall: No tenderness.  Abdominal:     General: Bowel sounds are normal.     Palpations: Abdomen is soft.     Tenderness: There is no abdominal tenderness.  Musculoskeletal:     Cervical back: Neck supple.  Lymphadenopathy:     Cervical: No cervical adenopathy.  Skin:    General: Skin is warm and dry.     Findings: No rash.  Neurological:     Mental Status: He is alert and oriented to person, place, and time.  Psychiatric:        Behavior: Behavior normal.        Thought Content: Thought content normal.        Judgment: Judgment normal.         03/15/2022    1:49 PM 01/15/2022   10:51 AM  Depression screen PHQ 2/9  Decreased Interest 0 0  Down, Depressed, Hopeless 0 0  PHQ - 2 Score 0 0       Assessment & Plan:    Patient Active Problem List   Diagnosis Date Noted   Healthcare maintenance 03/15/2022   HIV disease (HCC) 12/20/2021   Chest pain 03/02/2015   Abdominal pain, epigastric 04/09/2014   Nausea and vomiting 04/09/2014     Problem List Items Addressed This Visit       Other   HIV disease (HCC) - Primary    Aaron Roman continues to have well-controlled virus with good adherence and tolerance to his ART regimen of Biktarvy.  Does have some weight gain which we will continue to monitor.  Check lab work today.  Continue current dose of Biktarvy. Renew financial assistance. Plan for follow-up in 3 months or sooner if needed with lab work on the same day.      Relevant Medications   bictegravir-emtricitabine-tenofovir AF (BIKTARVY) 50-200-25 MG TABS tablet   Other Relevant Orders   Comprehensive metabolic panel   HIV-1 RNA quant-no reflex-bld   T-helper cell (CD4)- ([facility] clinic only)   Healthcare maintenance    Discussed importance of safe sexual practices and condom use.  Condoms offered. Discussed family-planning and answered questions. Prevnar  20 updated.        I am having Aaron Roman maintain his Biktarvy.   Meds ordered this encounter  Medications   bictegravir-emtricitabine-tenofovir AF (BIKTARVY) 50-200-25 MG TABS tablet    Sig: Take 1 tablet by mouth daily.    Dispense:  30 tablet    Refill:  3    Order Specific Question:   Supervising Provider    Answer:   Judyann Munson [[id number]]     Follow-up: Return in about 3 months (around 06/15/2022), or if symptoms worsen or fail to improve.   Marcos Eke, MSN, FNP-C Nurse Practitioner [facility] Main number: [phone]

## 2022-03-16 LAB — T-HELPER CELL (CD4) - (RCID CLINIC ONLY)
CD4 % Helper T Cell: 34 % (ref 33–65)
CD4 T Cell Abs: 623 /uL (ref 400–1790)

## 2022-03-19 LAB — HIV-1 RNA QUANT-NO REFLEX-BLD
HIV 1 RNA Quant: NOT DETECTED Copies/mL
HIV-1 RNA Quant, Log: NOT DETECTED Log cps/mL

## 2022-03-19 LAB — COMPREHENSIVE METABOLIC PANEL
AG Ratio: 1.7 (calc) (ref 1.0–2.5)
ALT: 26 U/L (ref 9–46)
AST: 24 U/L (ref 10–40)
Albumin: 4.5 g/dL (ref 3.6–5.1)
Alkaline phosphatase (APISO): 90 U/L (ref 36–130)
BUN: 8 mg/dL (ref 7–25)
CO2: 29 mmol/L (ref 20–32)
Calcium: 9.3 mg/dL (ref 8.6–10.3)
Chloride: 104 mmol/L (ref 98–110)
Creat: 0.92 mg/dL (ref 0.60–1.26)
Globulin: 2.7 g/dL (calc) (ref 1.9–3.7)
Glucose, Bld: 91 mg/dL (ref 65–99)
Potassium: 4.3 mmol/L (ref 3.5–5.3)
Sodium: 141 mmol/L (ref 135–146)
Total Bilirubin: 0.3 mg/dL (ref 0.2–1.2)
Total Protein: 7.2 g/dL (ref 6.1–8.1)

## 2022-07-06 ENCOUNTER — Encounter: Payer: Self-pay | Admitting: Family

## 2022-07-06 ENCOUNTER — Other Ambulatory Visit: Payer: Self-pay

## 2022-07-06 ENCOUNTER — Ambulatory Visit (INDEPENDENT_AMBULATORY_CARE_PROVIDER_SITE_OTHER): Payer: Self-pay | Admitting: Family

## 2022-07-06 VITALS — BP 130/84 | HR 77 | Temp 98.0°F | Resp 16 | Ht 72.0 in | Wt 189.0 lb

## 2022-07-06 DIAGNOSIS — Z113 Encounter for screening for infections with a predominantly sexual mode of transmission: Secondary | ICD-10-CM

## 2022-07-06 DIAGNOSIS — Z0000 Encounter for general adult medical examination without abnormal findings: Secondary | ICD-10-CM

## 2022-07-06 DIAGNOSIS — Z23 Encounter for immunization: Secondary | ICD-10-CM

## 2022-07-06 DIAGNOSIS — B20 Human immunodeficiency virus [HIV] disease: Secondary | ICD-10-CM

## 2022-07-06 MED ORDER — BIKTARVY 50-200-25 MG PO TABS: 1.0000 | ORAL_TABLET | Freq: Every day | ORAL | 5 refills | Status: DC

## 2022-07-06 NOTE — Progress Notes (Signed)
Brief Narrative   Patient ID: Aaron Roman, male    DOB: [birth year], 31 y.o.   MRN: [id number]  Mr. Aaron Roman is a 31 y/o AA male diagnosed with HIV disease on 12/09/21 with risk factor of history of drug use. Initial viral load 13,600 and CD4 count 561. Genotype with no significant medication resistant mutations. [id number] negative. No history of opportunistic infection. Enters care at [facility] Stage 1. Initial medication therapy with Biktarvy.   Subjective:    Chief Complaint  Patient presents with   Follow-up    B20     HPI:  Aaron Roman is a 30 y.o. male with HIV disease last seen on 03/15/2022 with well-controlled virus and good adherence and tolerance to [employer].  Viral load was undetectable with CD4 count of 623.  Kidney function, liver function, electrolytes within normal ranges.  Here today for routine follow-up.  Mr. Aaron Roman continues to do well and has some concerns about his recent weight gain.  Continues to take medication daily as prescribed with no adverse side effects and no problems obtaining from the pharmacy.  Condoms and STD testing offered.  Influenza vaccination requested.  Financial assistance is up-to-date and will need to be renewed in January.  Denies fevers, chills, night sweats, headaches, changes in vision, neck pain/stiffness, nausea, diarrhea, vomiting, lesions or rashes.    Wt Readings from Last 3 Encounters:  07/06/22 189 lb (85.7 kg)  03/15/22 171 lb (77.6 kg)  01/15/22 158 lb (71.7 kg)     Allergies  Allergen Reactions   Codeine Nausea And Vomiting   Pollen Extract Itching      Outpatient Medications Prior to Visit  Medication Sig Dispense Refill   bictegravir-emtricitabine-tenofovir AF (BIKTARVY) 50-200-25 MG TABS tablet Take 1 tablet by mouth daily. 30 tablet 3   No facility-administered medications prior to visit.     Past Medical History:  Diagnosis Date   Chronic abdominal pain    Chronic diarrhea    Cyclical vomiting with nausea     GERD (gastroesophageal reflux disease)      Past Surgical History:  Procedure Laterality Date   COLONOSCOPY     COLONOSCOPY N/A 02/04/2015   Procedure: COLONOSCOPY;  Surgeon: Rogene Houston, MD;  [facility];  Service: Endoscopy;  Laterality: N/A;  145 - moved to 7:30 - Ann notified pt   ESOPHAGOGASTRODUODENOSCOPY N/A 04/14/2014   Procedure: ESOPHAGOGASTRODUODENOSCOPY (EGD);  Surgeon: Rogene Houston, MD;  [facility];  Service: Endoscopy;  Laterality: N/A;  830   ESOPHAGOGASTRODUODENOSCOPY N/A 02/04/2015   Procedure: ESOPHAGOGASTRODUODENOSCOPY (EGD);  Surgeon: Rogene Houston, MD;  [facility];  Service: Endoscopy;  Laterality: N/A;   WISDOM TOOTH EXTRACTION        Review of Systems  Constitutional:  Negative for appetite change, chills, fatigue, fever and unexpected weight change.  Eyes:  Negative for visual disturbance.  Respiratory:  Negative for cough, chest tightness, shortness of breath and wheezing.   Cardiovascular:  Negative for chest pain and leg swelling.  Gastrointestinal:  Negative for abdominal pain, constipation, diarrhea, nausea and vomiting.  Genitourinary:  Negative for dysuria, flank pain, frequency, genital sores, hematuria and urgency.  Skin:  Negative for rash.  Allergic/Immunologic: Negative for immunocompromised state.  Neurological:  Negative for dizziness and headaches.      Objective:    BP 130/84   Pulse 77   Temp 98 F (36.7 C) (Oral)   Resp 16   Ht 6' (1.829  m)   Wt 189 lb (85.7 kg)   SpO2 100%   BMI 25.63 kg/m  Nursing note and vital signs reviewed.  Physical Exam Constitutional:      General: He is not in acute distress.    Appearance: He is well-developed.  Eyes:     Conjunctiva/sclera: Conjunctivae normal.  Cardiovascular:     Rate and Rhythm: Normal rate and regular rhythm.     Heart sounds: Normal heart sounds. No murmur heard.    No friction rub. No gallop.  Pulmonary:     Effort: Pulmonary  effort is normal. No respiratory distress.     Breath sounds: Normal breath sounds. No wheezing or rales.  Chest:     Chest wall: No tenderness.  Abdominal:     General: Bowel sounds are normal.     Palpations: Abdomen is soft.     Tenderness: There is no abdominal tenderness.  Musculoskeletal:     Cervical back: Neck supple.  Lymphadenopathy:     Cervical: No cervical adenopathy.  Skin:    General: Skin is warm and dry.     Findings: No rash.  Neurological:     Mental Status: He is alert and oriented to person, place, and time.  Psychiatric:        Behavior: Behavior normal.        Thought Content: Thought content normal.        Judgment: Judgment normal.         07/06/2022   11:14 AM 03/15/2022    1:49 PM 01/15/2022   10:51 AM  Depression screen PHQ 2/9  Decreased Interest 0 0 0  Down, Depressed, Hopeless 0 0 0  PHQ - 2 Score 0 0 0       Assessment & Plan:    Patient Active Problem List   Diagnosis Date Noted   Healthcare maintenance 03/15/2022   HIV disease (HCC) 12/20/2021   Chest pain 03/02/2015   Abdominal pain, epigastric 04/09/2014   Nausea and vomiting 04/09/2014     Problem List Items Addressed This Visit       Other   HIV disease [facility])    [name] continues to have well-controlled virus with good adherence and tolerance to [employer].  Reviewed previous lab work and discussed plan of care.  Has had significant weight gain since initial diagnosis although BMI is currently within normal ranges.  Continue current dose of Biktarvy.  Check lab work today.  Plan for follow-up in 3 months or sooner if needed with lab work and renewal of financial assistance on the same day.      Relevant Medications   bictegravir-emtricitabine-tenofovir AF (BIKTARVY) 50-200-25 MG TABS tablet   Other Relevant Orders   Comprehensive metabolic panel   HIV-1 RNA quant-no reflex-bld   T-helper cells (CD4) count (not at [facility])   Healthcare maintenance    Discussed importance of  safe sexual practice and condom use. Condoms and STD testing offered.  Influenza vaccine updated.      Other Visit Diagnoses     Need for immunization against influenza    -  Primary   Relevant Orders   Flu Vaccine QUAD 70mo+IM (Fluarix, Fluzone & Alfiuria Quad PF) (Completed)   Screening for STDs (sexually transmitted diseases)       Relevant Orders   RPR        I am having Juelz N. Mohon maintain his Biktarvy.   Meds ordered this encounter  Medications   bictegravir-emtricitabine-tenofovir AF (BIKTARVY)  50-200-25 MG TABS tablet    Sig: Take 1 tablet by mouth daily.    Dispense:  30 tablet    Refill:  5    Order Specific Question:   Supervising Provider    Answer:   Judyann Munson [[id number]]     Follow-up: Return in about 3 months (around 10/06/2022), or if symptoms worsen or fail to improve.   Marcos Eke, MSN, FNP-C Nurse Practitioner [facility] for Infectious Disease [facility] Main number: [phone]

## 2022-07-06 NOTE — Patient Instructions (Addendum)
Nice to see you.  We will check your lab work today.  Continue to take your medication daily as prescribed.  Refills have been sent to the pharmacy.  Plan for follow up in 3 months or sooner if needed with lab work on the same day.  Have a great day and stay safe!  

## 2022-07-06 NOTE — Assessment & Plan Note (Signed)
Discussed importance of safe sexual practice and condom use. Condoms and STD testing offered.  Influenza vaccine updated. 

## 2022-07-06 NOTE — Assessment & Plan Note (Signed)
Mr. Callanan continues to have well-controlled virus with good adherence and tolerance to [employer].  Reviewed previous lab work and discussed plan of care.  Has had significant weight gain since initial diagnosis although BMI is currently within normal ranges.  Continue current dose of Biktarvy.  Check lab work today.  Plan for follow-up in 3 months or sooner if needed with lab work and renewal of financial assistance on the same day.

## 2022-07-09 LAB — COMPREHENSIVE METABOLIC PANEL
AG Ratio: 1.8 (calc) (ref 1.0–2.5)
ALT: 33 U/L (ref 9–46)
AST: 25 U/L (ref 10–40)
Albumin: 4.5 g/dL (ref 3.6–5.1)
Alkaline phosphatase (APISO): 97 U/L (ref 36–130)
BUN: 9 mg/dL (ref 7–25)
CO2: 28 mmol/L (ref 20–32)
Calcium: 9.2 mg/dL (ref 8.6–10.3)
Chloride: 103 mmol/L (ref 98–110)
Creat: 1.04 mg/dL (ref 0.60–1.26)
Globulin: 2.5 g/dL (calc) (ref 1.9–3.7)
Glucose, Bld: 102 mg/dL — ABNORMAL HIGH (ref 65–99)
Potassium: 4.3 mmol/L (ref 3.5–5.3)
Sodium: 140 mmol/L (ref 135–146)
Total Bilirubin: 0.4 mg/dL (ref 0.2–1.2)
Total Protein: 7 g/dL (ref 6.1–8.1)

## 2022-07-09 LAB — RPR: RPR Ser Ql: NONREACTIVE

## 2022-07-09 LAB — T-HELPER CELLS (CD4) COUNT (NOT AT ARMC)
Absolute CD4: 863 cells/uL (ref 490–1740)
CD4 T Helper %: 37 % (ref 30–61)
Total lymphocyte count: 2339 cells/uL (ref 850–3900)

## 2022-07-09 LAB — HIV-1 RNA QUANT-NO REFLEX-BLD
HIV 1 RNA Quant: NOT DETECTED Copies/mL
HIV-1 RNA Quant, Log: NOT DETECTED Log cps/mL

## 2022-08-20 ENCOUNTER — Other Ambulatory Visit (HOSPITAL_COMMUNITY): Payer: Self-pay

## 2022-10-18 NOTE — Progress Notes (Signed)
Brief Narrative   Patient ID: Aaron Roman, male    DOB: 1991-02-23, 32 y.o.   MRN: [id number]  Aaron Roman is a 32 y/o AA male diagnosed with HIV disease on 12/09/21 with risk factor of history of drug use. Initial viral load 13,600 and CD4 count 561. Genotype with no significant medication resistant mutations. [id number] negative. No history of opportunistic infection. Enters care at [facility] Stage 1. Initial medication therapy with Biktarvy.    Subjective:    Chief Complaint  Patient presents with   Follow-up    Sister/    HIV Positive/AIDS    HPI:  Aaron Roman is a 32 y.o. male with HIV disease last seen on 07/06/22 with well controlled virus and good adherence and tolerance to Aaron Roman. Viral load was undetectable and CD4 count 863. RPR non-reactive. Renal function and electrolytes within normal ranges. Here today for routine follow up.   Aaron Roman has been doing well since his last office visit and continues to take his Aaron Roman with no adverse side effects or problems getting medication from the pharmacy. Continues to work. Asking how he can be more healthy. Condoms and STD testing offered. Due for Menveo.    Allergies  Allergen Reactions   Codeine Nausea And Vomiting   Pollen Extract Itching      Outpatient Medications Prior to Visit  Medication Sig Dispense Refill   bictegravir-emtricitabine-tenofovir AF (BIKTARVY) 50-200-25 MG TABS tablet Take 1 tablet by mouth daily. 30 tablet 5   No facility-administered medications prior to visit.     Past Medical History:  Diagnosis Date   Chronic abdominal pain    Chronic diarrhea    Cyclical vomiting with nausea    GERD (gastroesophageal reflux disease)      Past Surgical History:  Procedure Laterality Date   COLONOSCOPY     COLONOSCOPY N/A 02/04/2015   Procedure: COLONOSCOPY;  Surgeon: Rogene Houston, MD;  [facility];  Service: Endoscopy;  Laterality: N/A;  145 - moved to 7:30 - Ann notified pt    ESOPHAGOGASTRODUODENOSCOPY N/A 04/14/2014   Procedure: ESOPHAGOGASTRODUODENOSCOPY (EGD);  Surgeon: Rogene Houston, MD;  [facility];  Service: Endoscopy;  Laterality: N/A;  830   ESOPHAGOGASTRODUODENOSCOPY N/A 02/04/2015   Procedure: ESOPHAGOGASTRODUODENOSCOPY (EGD);  Surgeon: Rogene Houston, MD;  [facility];  Service: Endoscopy;  Laterality: N/A;   WISDOM TOOTH EXTRACTION        Review of Systems  Constitutional:  Negative for appetite change, chills, fatigue, fever and unexpected weight change.  Eyes:  Negative for visual disturbance.  Respiratory:  Negative for cough, chest tightness, shortness of breath and wheezing.   Cardiovascular:  Negative for chest pain and leg swelling.  Gastrointestinal:  Negative for abdominal pain, constipation, diarrhea, nausea and vomiting.  Genitourinary:  Negative for dysuria, flank pain, frequency, genital sores, hematuria and urgency.  Skin:  Negative for rash.  Allergic/Immunologic: Negative for immunocompromised state.  Neurological:  Negative for dizziness and headaches.      Objective:    BP 129/85   Pulse 88   Temp 98.1 F (36.7 C) (Oral)   Wt 207 lb (93.9 kg)   SpO2 96%   BMI 28.07 kg/m  Nursing note and vital signs reviewed.  Physical Exam Constitutional:      General: He is not in acute distress.    Appearance: He is well-developed.  Eyes:     Conjunctiva/sclera: Conjunctivae normal.  Cardiovascular:     Rate  and Rhythm: Normal rate and regular rhythm.     Heart sounds: Normal heart sounds. No murmur heard.    No friction rub. No gallop.  Pulmonary:     Effort: Pulmonary effort is normal. No respiratory distress.     Breath sounds: Normal breath sounds. No wheezing or rales.  Chest:     Chest wall: No tenderness.  Abdominal:     General: Bowel sounds are normal.     Palpations: Abdomen is soft.     Tenderness: There is no abdominal tenderness.  Musculoskeletal:     Cervical back: Neck supple.   Lymphadenopathy:     Cervical: No cervical adenopathy.  Skin:    General: Skin is warm and dry.     Findings: No rash.  Neurological:     Mental Status: He is alert and oriented to person, place, and time.  Psychiatric:        Behavior: Behavior normal.        Thought Content: Thought content normal.        Judgment: Judgment normal.         07/06/2022   11:14 AM 03/15/2022    1:49 PM 01/15/2022   10:51 AM  Depression screen PHQ 2/9  Decreased Interest 0 0 0  Down, Depressed, Hopeless 0 0 0  PHQ - 2 Score 0 0 0       Assessment & Plan:    Patient Active Problem List   Diagnosis Date Noted   Tobacco use 10/22/2022   Healthcare maintenance 03/15/2022   HIV disease ([place]) 12/20/2021   Chest pain 03/02/2015   Abdominal pain, epigastric 04/09/2014   Nausea and vomiting 04/09/2014     Problem List Items Addressed This Visit       Other   HIV disease ([place]) - Primary    Aaron Roman continues to have well controlled virus with good adherence and tolerance to Biktarvy. Check lab work today. Meet with financial team to ensure continued coverage through Medicaid. Continue current dose of Biktarvy. Reviewed ways to stay healthy including nutrition, physical activity and cessation of tobacco use. Plan for follow up in 4 months or sooner if needed with lab work on the same day.       Relevant Medications   bictegravir-emtricitabine-tenofovir AF (BIKTARVY) 50-200-25 MG TABS tablet   Other Relevant Orders   BASIC METABOLIC PANEL WITH GFR   HIV-1 RNA quant-no reflex-bld   T-helper cell (CD4)- ([facility] clinic only)   MENINGOCOCCAL MCV4O (Completed)   Healthcare maintenance    Discussed importance of safe sexual practice and condom use. Condoms and STD testing offered.  Menveo updated.  Due for routine dental care.      Tobacco use    Aaron Roman continues to use tobacco daily. Discussed importance of tobacco cessation to reduce risk of cardiovascular, respiratory, and malignant disease in  the future through the use of patches, gums and medications. In the pre-contemplation stage of change and not ready to quit at this time.       Other Visit Diagnoses     Screening for STDs (sexually transmitted diseases)       Need for meningitis vaccination       Relevant Orders   MENINGOCOCCAL MCV4O (Completed)        I am having Aaron Roman maintain his Biktarvy.   Meds ordered this encounter  Medications   bictegravir-emtricitabine-tenofovir AF (BIKTARVY) 50-200-25 MG TABS tablet    Sig: Take 1 tablet by mouth daily.  Dispense:  30 tablet    Refill:  5    Order Specific Question:   Supervising Provider    Answer:   Carlyle Basques [[id number]]     Follow-up: Return in about 4 months (around 02/20/2023), or if symptoms worsen or fail to improve.   Terri Piedra, MSN, FNP-C Nurse Practitioner [facility] for Infectious Disease [place] number: [phone]

## 2022-10-22 ENCOUNTER — Ambulatory Visit: Payer: Medicaid Other

## 2022-10-22 ENCOUNTER — Other Ambulatory Visit: Payer: Self-pay

## 2022-10-22 ENCOUNTER — Encounter: Payer: Self-pay | Admitting: Family

## 2022-10-22 ENCOUNTER — Ambulatory Visit (INDEPENDENT_AMBULATORY_CARE_PROVIDER_SITE_OTHER): Payer: Medicaid Other | Admitting: Family

## 2022-10-22 VITALS — BP 129/85 | HR 88 | Temp 98.1°F | Wt 207.0 lb

## 2022-10-22 DIAGNOSIS — Z720 Tobacco use: Secondary | ICD-10-CM

## 2022-10-22 DIAGNOSIS — B20 Human immunodeficiency virus [HIV] disease: Secondary | ICD-10-CM | POA: Diagnosis present

## 2022-10-22 DIAGNOSIS — Z0000 Encounter for general adult medical examination without abnormal findings: Secondary | ICD-10-CM | POA: Diagnosis not present

## 2022-10-22 DIAGNOSIS — Z23 Encounter for immunization: Secondary | ICD-10-CM

## 2022-10-22 DIAGNOSIS — Z113 Encounter for screening for infections with a predominantly sexual mode of transmission: Secondary | ICD-10-CM | POA: Diagnosis not present

## 2022-10-22 MED ORDER — BIKTARVY 50-200-25 MG PO TABS: 1.0000 | ORAL_TABLET | Freq: Every day | ORAL | 5 refills | Status: DC

## 2022-10-22 NOTE — Assessment & Plan Note (Signed)
Aaron Roman continues to use tobacco daily. Discussed importance of tobacco cessation to reduce risk of cardiovascular, respiratory, and malignant disease in the future through the use of patches, gums and medications. In the pre-contemplation stage of change and not ready to quit at this time.

## 2022-10-22 NOTE — Assessment & Plan Note (Signed)
Discussed importance of safe sexual practice and condom use. Condoms and STD testing offered.  Menveo updated.  Due for routine dental care.

## 2022-10-22 NOTE — Patient Instructions (Addendum)
Nice to see you.  We will check your lab work today.  Continue to take your medication daily as prescribed.  Refills have been sent to the pharmacy.  Plan for follow up in 4 months or sooner if needed with lab work on the same day.  Have a great day and stay safe!

## 2022-10-22 NOTE — Assessment & Plan Note (Signed)
Ayson continues to have well controlled virus with good adherence and tolerance to [employer]. Check lab work today. Meet with financial team to ensure continued coverage through Medicaid. Continue current dose of Biktarvy. Reviewed ways to stay healthy including nutrition, physical activity and cessation of tobacco use. Plan for follow up in 4 months or sooner if needed with lab work on the same day.

## 2022-10-23 LAB — T-HELPER CELL (CD4) - (RCID CLINIC ONLY)
CD4 % Helper T Cell: 40 % (ref 33–65)
CD4 T Cell Abs: 802 /uL (ref 400–1790)

## 2022-10-24 LAB — BASIC METABOLIC PANEL WITH GFR
BUN: 7 mg/dL (ref 7–25)
CO2: 31 mmol/L (ref 20–32)
Calcium: 9.3 mg/dL (ref 8.6–10.3)
Chloride: 103 mmol/L (ref 98–110)
Creat: 1 mg/dL (ref 0.60–1.26)
Glucose, Bld: 110 mg/dL — ABNORMAL HIGH (ref 65–99)
Potassium: 4.2 mmol/L (ref 3.5–5.3)
Sodium: 142 mmol/L (ref 135–146)
eGFR: 102 mL/min/{1.73_m2} (ref >=60)

## 2022-10-24 LAB — HIV-1 RNA QUANT-NO REFLEX-BLD
HIV 1 RNA Quant: NOT DETECTED Copies/mL
HIV-1 RNA Quant, Log: NOT DETECTED Log cps/mL

## 2023-02-19 ENCOUNTER — Other Ambulatory Visit: Payer: Self-pay

## 2023-02-19 ENCOUNTER — Ambulatory Visit (INDEPENDENT_AMBULATORY_CARE_PROVIDER_SITE_OTHER): Payer: Medicaid Other | Admitting: Family

## 2023-02-19 ENCOUNTER — Encounter: Payer: Self-pay | Admitting: Family

## 2023-02-19 VITALS — BP 126/83 | HR 92 | Resp 16 | Ht 72.0 in | Wt 206.0 lb

## 2023-02-19 DIAGNOSIS — Z113 Encounter for screening for infections with a predominantly sexual mode of transmission: Secondary | ICD-10-CM

## 2023-02-19 DIAGNOSIS — Z0000 Encounter for general adult medical examination without abnormal findings: Secondary | ICD-10-CM

## 2023-02-19 DIAGNOSIS — Z23 Encounter for immunization: Secondary | ICD-10-CM

## 2023-02-19 DIAGNOSIS — F17210 Nicotine dependence, cigarettes, uncomplicated: Secondary | ICD-10-CM | POA: Diagnosis not present

## 2023-02-19 DIAGNOSIS — B20 Human immunodeficiency virus [HIV] disease: Secondary | ICD-10-CM

## 2023-02-19 DIAGNOSIS — Z720 Tobacco use: Secondary | ICD-10-CM

## 2023-02-19 MED ORDER — BIKTARVY 50-200-25 MG PO TABS: 1.0000 | ORAL_TABLET | Freq: Every day | ORAL | 5 refills | Status: DC

## 2023-02-19 NOTE — Assessment & Plan Note (Signed)
Discussed importance of safe sexual practice and condom use. Condoms and STD testing offered.  2nd dose Menveo updated. Will schedule dental care independently. Can refer to [facility] if needed.  Introduced anal cancer screening through anal pap and will consider at next office visit.

## 2023-02-19 NOTE — Patient Instructions (Signed)
Nice to see you. ? ?We will check your lab work today. ? ?Continue to take your medication daily as prescribed. ? ?Refills have been sent to the pharmacy. ? ?Plan for follow up in 6 months or sooner if needed with lab work on the same day. ? ?Have a great day and stay safe! ? ?

## 2023-02-19 NOTE — Assessment & Plan Note (Signed)
Aaron Roman continues to have well-controlled virus with good adherence and tolerance to [place].  Reviewed previous lab work and discussed plan of care and U equals U.  Reviewed Medicaid coverage versus that of [employer].  Check blood work.  Continue current dose of Biktarvy.  Plan for follow-up in 6 months or sooner if needed with lab work on the same day.

## 2023-02-19 NOTE — Progress Notes (Signed)
Brief Narrative   Patient ID: Aaron Roman, male    DOB: 1990-10-08, 32 y.o.   MRN: [id number]  Aaron Roman is a 32 y/o AA male diagnosed with HIV disease on 12/09/21 with risk factor of history of drug use. Initial viral load 13,600 and CD4 count 561. Genotype with no significant medication resistant mutations. [id number] negative. No history of opportunistic infection. Enters care at [facility] Stage 1. Initial medication therapy with Biktarvy.    Subjective:    Chief Complaint  Patient presents with   Follow-up    HPI:  Aaron Roman is a 32 y.o. male with HIV disease last seen on 10/22/2022 with well-controlled virus and good adherence and tolerance to [employer].  Viral load was undetectable with CD4 count 802.  Kidney function and electrolytes within normal ranges.  Here today for routine follow-up.  Aaron Roman has been doing well since his last office visit and continues to take [facility] as prescribed with no adverse side effects or problems obtaining medication.  Now covered through Medicaid.  No new concerns/complaints.  Condoms and STD testing offered.  Healthcare maintenance due includes routine dental care, Menveo and tetanus vaccination.   Denies fevers, chills, night sweats, headaches, changes in vision, neck pain/stiffness, nausea, diarrhea, vomiting, lesions or rashes.   Allergies  Allergen Reactions   Codeine Nausea And Vomiting   Pollen Extract Itching      Outpatient Medications Prior to Visit  Medication Sig Dispense Refill   bictegravir-emtricitabine-tenofovir AF (BIKTARVY) 50-200-25 MG TABS tablet Take 1 tablet by mouth daily. 30 tablet 5   No facility-administered medications prior to visit.     Past Medical History:  Diagnosis Date   Chronic abdominal pain    Chronic diarrhea    Cyclical vomiting with nausea    GERD (gastroesophageal reflux disease)      Past Surgical History:  Procedure Laterality Date   COLONOSCOPY     COLONOSCOPY N/A 02/04/2015    Procedure: COLONOSCOPY;  Surgeon: Malissa Hippo, MD;  [facility];  Service: Endoscopy;  Laterality: N/A;  145 - moved to 7:30 - Ann notified pt   ESOPHAGOGASTRODUODENOSCOPY N/A 04/14/2014   Procedure: ESOPHAGOGASTRODUODENOSCOPY (EGD);  Surgeon: Malissa Hippo, MD;  [facility];  Service: Endoscopy;  Laterality: N/A;  830   ESOPHAGOGASTRODUODENOSCOPY N/A 02/04/2015   Procedure: ESOPHAGOGASTRODUODENOSCOPY (EGD);  Surgeon: Malissa Hippo, MD;  [facility];  Service: Endoscopy;  Laterality: N/A;   WISDOM TOOTH EXTRACTION        Review of Systems  Constitutional:  Negative for appetite change, chills, fatigue, fever and unexpected weight change.  Eyes:  Negative for visual disturbance.  Respiratory:  Negative for cough, chest tightness, shortness of breath and wheezing.   Cardiovascular:  Negative for chest pain and leg swelling.  Gastrointestinal:  Negative for abdominal pain, constipation, diarrhea, nausea and vomiting.  Genitourinary:  Negative for dysuria, flank pain, frequency, genital sores, hematuria and urgency.  Skin:  Negative for rash.  Allergic/Immunologic: Negative for immunocompromised state.  Neurological:  Negative for dizziness and headaches.      Objective:    BP 126/83   Pulse 92   Resp 16   Ht 6' (1.829 m)   Wt 206 lb (93.4 kg)   BMI 27.94 kg/m  Nursing note and vital signs reviewed.  Physical Exam Constitutional:      General: He is not in acute distress.    Appearance: He is well-developed.  Eyes:  Conjunctiva/sclera: Conjunctivae normal.  Cardiovascular:     Rate and Rhythm: Normal rate and regular rhythm.     Heart sounds: Normal heart sounds. No murmur heard.    No friction rub. No gallop.  Pulmonary:     Effort: Pulmonary effort is normal. No respiratory distress.     Breath sounds: Normal breath sounds. No wheezing or rales.  Chest:     Chest wall: No tenderness.  Abdominal:     General: Bowel sounds are  normal.     Palpations: Abdomen is soft.     Tenderness: There is no abdominal tenderness.  Musculoskeletal:     Cervical back: Neck supple.  Lymphadenopathy:     Cervical: No cervical adenopathy.  Skin:    General: Skin is warm and dry.     Findings: No rash.  Neurological:     Mental Status: He is alert and oriented to person, place, and time.  Psychiatric:        Behavior: Behavior normal.        Thought Content: Thought content normal.        Judgment: Judgment normal.         02/19/2023   10:42 AM 07/06/2022   11:14 AM 03/15/2022    1:49 PM 01/15/2022   10:51 AM  Depression screen PHQ 2/9  Decreased Interest 0 0 0 0  Down, Depressed, Hopeless 0 0 0 0  PHQ - 2 Score 0 0 0 0       Assessment & Plan:    Patient Active Problem List   Diagnosis Date Noted   Tobacco use 10/22/2022   Healthcare maintenance 03/15/2022   HIV disease ([facility]) 12/20/2021   Chest pain 03/02/2015   Abdominal pain, epigastric 04/09/2014   Nausea and vomiting 04/09/2014     Problem List Items Addressed This Visit       Other   HIV disease ([facility]) - Primary    Aaron Roman continues to have well-controlled virus with good adherence and tolerance to [employer].  Reviewed previous lab work and discussed plan of care and U equals U.  Reviewed Medicaid coverage versus that of [employer].  Check blood work.  Continue current dose of Biktarvy.  Plan for follow-up in 6 months or sooner if needed with lab work on the same day.      Relevant Medications   bictegravir-emtricitabine-tenofovir AF (BIKTARVY) 50-200-25 MG TABS tablet   Other Relevant Orders   COMPLETE METABOLIC PANEL WITH GFR   HIV-1 RNA quant-no reflex-bld   T-helper cell (CD4)- ([facility] clinic only)   Healthcare maintenance    Discussed importance of safe sexual practice and condom use. Condoms and STD testing offered.  2nd dose Menveo updated. Will schedule dental care independently. Can refer to [facility] if needed.  Introduced anal cancer  screening through anal pap and will consider at next office visit.       Tobacco use    Mr. Persad continues to smoke tobacco daily.  Discussed importance of tobacco cessation to reduce risk of cardiovascular, respiratory, renal, and malignant disease in the future.  He is in the precontemplation stage and not ready to quit at this time.      Other Visit Diagnoses     Screening for STDs (sexually transmitted diseases)       Relevant Orders   RPR   Need for meningitis vaccination       Relevant Orders   MENINGOCOCCAL MCV4O(MENVEO) (Completed)  I am having Lonie N. Pellegrino maintain his Biktarvy.   Meds ordered this encounter  Medications   bictegravir-emtricitabine-tenofovir AF (BIKTARVY) 50-200-25 MG TABS tablet    Sig: Take 1 tablet by mouth daily.    Dispense:  30 tablet    Refill:  5    Order Specific Question:   Supervising Provider    Answer:   Judyann Munson [[id number]]     Follow-up: Return in about 6 months (around 08/21/2023), or if symptoms worsen or fail to improve.   Marcos Eke, MSN, FNP-C Nurse Practitioner [facility] for Infectious Disease [facility] Main number: [phone]

## 2023-02-19 NOTE — Assessment & Plan Note (Signed)
Mr. Schueneman continues to smoke tobacco daily.  Discussed importance of tobacco cessation to reduce risk of cardiovascular, respiratory, renal, and malignant disease in the future.  He is in the precontemplation stage and not ready to quit at this time.

## 2023-02-20 LAB — T-HELPER CELL (CD4) - (RCID CLINIC ONLY)
CD4 % Helper T Cell: 38 % (ref 33–65)
CD4 T Cell Abs: 865 /uL (ref 400–1790)

## 2023-02-21 LAB — HIV-1 RNA QUANT-NO REFLEX-BLD
HIV 1 RNA Quant: NOT DETECTED Copies/mL
HIV-1 RNA Quant, Log: NOT DETECTED Log cps/mL

## 2023-02-21 LAB — COMPLETE METABOLIC PANEL WITH GFR
AG Ratio: 2 (calc) (ref 1.0–2.5)
ALT: 29 U/L (ref 9–46)
AST: 22 U/L (ref 10–40)
Albumin: 4.9 g/dL (ref 3.6–5.1)
Alkaline phosphatase (APISO): 116 U/L (ref 36–130)
BUN: 10 mg/dL (ref 7–25)
CO2: 30 mmol/L (ref 20–32)
Calcium: 9.6 mg/dL (ref 8.6–10.3)
Chloride: 101 mmol/L (ref 98–110)
Creat: 1.18 mg/dL (ref 0.60–1.26)
Globulin: 2.5 g/dL (calc) (ref 1.9–3.7)
Glucose, Bld: 110 mg/dL — ABNORMAL HIGH (ref 65–99)
Potassium: 4.1 mmol/L (ref 3.5–5.3)
Sodium: 137 mmol/L (ref 135–146)
Total Bilirubin: 0.4 mg/dL (ref 0.2–1.2)
Total Protein: 7.4 g/dL (ref 6.1–8.1)
eGFR: 84 mL/min/{1.73_m2} (ref >=60)

## 2023-02-21 LAB — RPR: RPR Ser Ql: NONREACTIVE

## 2023-08-05 ENCOUNTER — Ambulatory Visit: Payer: Medicaid Other | Admitting: Family

## 2023-08-19 ENCOUNTER — Other Ambulatory Visit: Payer: Self-pay

## 2023-08-19 DIAGNOSIS — B20 Human immunodeficiency virus [HIV] disease: Secondary | ICD-10-CM

## 2023-08-19 MED ORDER — BIKTARVY 50-200-25 MG PO TABS: 1.0000 | ORAL_TABLET | Freq: Every day | ORAL | 0 refills | Status: DC

## 2023-09-05 ENCOUNTER — Other Ambulatory Visit: Payer: Self-pay

## 2023-09-05 ENCOUNTER — Ambulatory Visit (INDEPENDENT_AMBULATORY_CARE_PROVIDER_SITE_OTHER): Payer: MEDICAID | Admitting: Family

## 2023-09-05 ENCOUNTER — Encounter: Payer: Self-pay | Admitting: Family

## 2023-09-05 VITALS — BP 124/85 | HR 103 | Temp 97.3°F | Resp 16 | Ht 72.0 in | Wt 197.8 lb

## 2023-09-05 DIAGNOSIS — Z23 Encounter for immunization: Secondary | ICD-10-CM

## 2023-09-05 DIAGNOSIS — Z720 Tobacco use: Secondary | ICD-10-CM

## 2023-09-05 DIAGNOSIS — F1120 Opioid dependence, uncomplicated: Secondary | ICD-10-CM | POA: Diagnosis not present

## 2023-09-05 DIAGNOSIS — B20 Human immunodeficiency virus [HIV] disease: Secondary | ICD-10-CM | POA: Diagnosis not present

## 2023-09-05 DIAGNOSIS — F17210 Nicotine dependence, cigarettes, uncomplicated: Secondary | ICD-10-CM

## 2023-09-05 DIAGNOSIS — Z0000 Encounter for general adult medical examination without abnormal findings: Secondary | ICD-10-CM | POA: Diagnosis not present

## 2023-09-05 MED ORDER — BIKTARVY 50-200-25 MG PO TABS: 1.0000 | ORAL_TABLET | Freq: Every day | ORAL | 5 refills | Status: DC

## 2023-09-05 NOTE — Assessment & Plan Note (Signed)
 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 Requested resources for rehabilitation. Provided counseling services for CBT and to aid in coping mechanisms. Will provide any additional resources as they are found.

## 2023-09-05 NOTE — Patient Instructions (Addendum)
 Nice to see you.  We will check your lab work today.  Continue to take your medication daily as prescribed.  Refills have been sent to the pharmacy.  Please call [facility]) to schedule/follow up on your dental care at [phone]  Plan for follow up in 3  months or sooner if needed with lab work on the same day.  Have a great day and stay safe!   [facility] [phone] Crisis Line [phone]  [facility] [address]  Address: [address], [place], [place] [address] Phone: [phone]

## 2023-09-05 NOTE — Assessment & Plan Note (Signed)
 Discussed importance of safe sexual practice and condom use. Condoms and site specific STD testing offered.  Vaccinations reviewed - tetanus, influenza, and Covid updated. Due for routine dental care. Referral placed to [facility].

## 2023-09-05 NOTE — Assessment & Plan Note (Signed)
 Aaron Roman continues to use tobacco. Discussed importance of tobacco cessation to reduce risk of disease or complication in the future. Not ready to quit at this time.

## 2023-09-05 NOTE — Progress Notes (Signed)
 Brief Narrative   Patient ID: Aaron Roman, male    DOB: 17-Jan-1991, 33 y.o.   MRN: [id number]  Aaron Roman is a 33 y/o AA male diagnosed with HIV disease on 12/09/21 with risk factor of history of drug use. Initial viral load 13,600 and CD4 count 561. Genotype with no significant medication resistant mutations. [id number] negative. No history of opportunistic infection. Enters care at [facility] Stage 1. Initial medication therapy with Biktarvy .    Subjective:    Chief Complaint  Patient presents with   Follow-up    B20     HPI:  Aaron Roman is a 33 y.o. male with HIV disease last seen on 02/19/2023 with well-controlled virus and good adherence and tolerance to Biktarvy .  Viral load was undetectable with CD4 count 865.  Kidney function, liver function, electrolytes within normal ranges.  RPR was nonreactive for syphilis.  Here today for routine follow-up.  Aaron Roman has been doing okay since his last office visit and continues to take Biktarvy  as prescribed with no adverse side effects or problems obtaining medication from pharmacy.  Has concerns about continued heroin use and is interested in a rehabilitation facility.  Had at one point stopped using heroin but restarted upon his diagnosis with HIV.  Has not seen mental health.  Denies feelings of being down, depressed, or hopeless with no suicidal ideations or signs of psychosis. Concerned that 6 months between appointments is challenging for him to manage and would prefer every 3 months.   Condoms and site-specific STD testing offered.  Healthcare maintenance reviewed.  Due for routine dental care and excepting referral to [facility].   Denies fevers, chills, night sweats, headaches, changes in vision, neck pain/stiffness, nausea, diarrhea, vomiting, lesions or rashes.  Lab Results  Component Value Date   CD4TCELL 38 02/19/2023   CD4TABS 865 02/19/2023   Lab Results  Component Value Date   HIV1RNAQUANT Not Detected 02/19/2023     Allergies   Allergen Reactions   Codeine Nausea And Vomiting   Pollen Extract Itching      Outpatient Medications Prior to Visit  Medication Sig Dispense Refill   bictegravir-emtricitabine -tenofovir  AF (BIKTARVY ) 50-200-25 MG TABS tablet Take 1 tablet by mouth daily. 30 tablet 0   No facility-administered medications prior to visit.     Past Medical History:  Diagnosis Date   Chronic abdominal pain    Chronic diarrhea    Cyclical vomiting with nausea    GERD (gastroesophageal reflux disease)      Past Surgical History:  Procedure Laterality Date   COLONOSCOPY     COLONOSCOPY N/A 02/04/2015   Procedure: COLONOSCOPY;  Surgeon: Claudis RAYMOND Rivet, MD;  [facility];  Service: Endoscopy;  Laterality: N/A;  145 - moved to 7:30 - Ann notified pt   ESOPHAGOGASTRODUODENOSCOPY N/A 04/14/2014   Procedure: ESOPHAGOGASTRODUODENOSCOPY (EGD);  Surgeon: Claudis RAYMOND Rivet, MD;  [facility];  Service: Endoscopy;  Laterality: N/A;  830   ESOPHAGOGASTRODUODENOSCOPY N/A 02/04/2015   Procedure: ESOPHAGOGASTRODUODENOSCOPY (EGD);  Surgeon: Claudis RAYMOND Rivet, MD;  [facility];  Service: Endoscopy;  Laterality: N/A;   WISDOM TOOTH EXTRACTION        Review of Systems  Constitutional:  Negative for appetite change, chills, fatigue, fever and unexpected weight change.  Eyes:  Negative for visual disturbance.  Respiratory:  Negative for cough, chest tightness, shortness of breath and wheezing.   Cardiovascular:  Negative for chest pain and leg swelling.  Gastrointestinal:  Negative  for abdominal pain, constipation, diarrhea, nausea and vomiting.  Genitourinary:  Negative for dysuria, flank pain, frequency, genital sores, hematuria and urgency.  Skin:  Negative for rash.  Allergic/Immunologic: Negative for immunocompromised state.  Neurological:  Negative for dizziness and headaches.      Objective:    BP 124/85   Pulse (!) 103   Temp (!) 97.3 F (36.3 C) (Temporal)   Resp 16    Ht 6' (1.829 m)   Wt 197 lb 12.8 oz (89.7 kg)   SpO2 98%   BMI 26.83 kg/m  Nursing note and vital signs reviewed.  Physical Exam Constitutional:      General: Aaron Roman is not in acute distress.    Appearance: Aaron Roman is well-developed.  Eyes:     Conjunctiva/sclera: Conjunctivae normal.  Cardiovascular:     Rate and Rhythm: Normal rate and regular rhythm.     Heart sounds: Normal heart sounds. No murmur heard.    No friction rub. No gallop.  Pulmonary:     Effort: Pulmonary effort is normal. No respiratory distress.     Breath sounds: Normal breath sounds. No wheezing or rales.  Chest:     Chest wall: No tenderness.  Abdominal:     General: Bowel sounds are normal.     Palpations: Abdomen is soft.     Tenderness: There is no abdominal tenderness.  Musculoskeletal:     Cervical back: Neck supple.  Lymphadenopathy:     Cervical: No cervical adenopathy.  Skin:    General: Skin is warm and dry.     Findings: No rash.  Neurological:     Mental Status: Aaron Roman is alert and oriented to person, place, and time.         09/05/2023   12:57 PM 02/19/2023   10:42 AM 07/06/2022   11:14 AM 03/15/2022    1:49 PM 01/15/2022   10:51 AM  Depression screen PHQ 2/9  Decreased Interest 0 0 0 0 0  Down, Depressed, Hopeless 0 0 0 0 0  PHQ - 2 Score 0 0 0 0 0       Assessment & Plan:    Patient Active Problem List   Diagnosis Date Noted   Nausea and vomiting 04/09/2014    Priority: 15.   Heroin addiction (HCC) 09/05/2023   Tobacco use 10/22/2022   Healthcare maintenance 03/15/2022   HIV disease (HCC) 12/20/2021   Chest pain 03/02/2015   Abdominal pain, epigastric 04/09/2014     Problem List Items Addressed This Visit       Other   HIV disease (HCC)   Aaron Roman continues to have well controlled virus with good adherence and tolerance to Biktarvy .  Reviewed lab work and discussed plan of care, U equals U, and family planning. Check lab work. Continue current dose of Biktarvy . Plan for follow  up in  3 months or sooner if needed with lab work on the same day.        Relevant Medications   bictegravir-emtricitabine -tenofovir  AF (BIKTARVY ) 50-200-25 MG TABS tablet   Other Relevant Orders   COMPLETE METABOLIC PANEL WITH GFR   HIV-1 RNA quant-no reflex-bld   T-helper cells (CD4) count (not at [facility])   Healthcare maintenance   Discussed importance of safe sexual practice and condom use. Condoms and site specific STD testing offered.  Vaccinations reviewed - tetanus, influenza, and Covid updated. Due for routine dental care. Referral placed to [facility].        Tobacco use   Aaron Roman  continues to use tobacco. Discussed importance of tobacco cessation to reduce risk of disease or complication in the future. Not ready to quit at this time.      Heroin addiction (HCC)   Aaron Roman continues to use heroin in part related to his coping with his HIV diagnosis. Requested resources for rehabilitation. Provided counseling services for CBT and to aid in coping mechanisms. Will provide any additional resources as they are found.       Other Visit Diagnoses       Encounter for immunization    -  Primary   Relevant Orders   Tdap vaccine greater than or equal to 7yo IM (Completed)   Flu vaccine trivalent PF, 6mos and older(Flulaval,Afluria,Fluarix,Fluzone) (Completed)   Pfizer Comirnaty Covid-19 Vaccine 34yrs & older (Completed)        I am having Aaron Roman maintain his Biktarvy .   Meds ordered this encounter  Medications   bictegravir-emtricitabine -tenofovir  AF (BIKTARVY ) 50-200-25 MG TABS tablet    Sig: Take 1 tablet by mouth daily.    Dispense:  30 tablet    Refill:  5    Please mail    Supervising Provider:   LUIZ Roman [phone]    Prescription Type::   Renewal     Follow-up: Return in about 3 months (around 12/04/2023). or sooner if needed.    Cathlyn July, MSN, FNP-C Nurse Practitioner [facility] for [facility]ious [facility] Main number:  [phone]

## 2023-09-05 NOTE — Assessment & Plan Note (Signed)
 Peggy continues to have well controlled virus with good adherence and tolerance to Biktarvy .  Reviewed lab work and discussed plan of care, U equals U, and family planning. Check lab work. Continue current dose of Biktarvy . Plan for follow up in  3 months or sooner if needed with lab work on the same day.

## 2023-09-07 LAB — COMPLETE METABOLIC PANEL WITH GFR
AG Ratio: 1.6 (calc) (ref 1.0–2.5)
ALT: 51 U/L — ABNORMAL HIGH (ref 9–46)
AST: 32 U/L (ref 10–40)
Albumin: 5.1 g/dL (ref 3.6–5.1)
Alkaline phosphatase (APISO): 159 U/L — ABNORMAL HIGH (ref 36–130)
BUN: 11 mg/dL (ref 7–25)
CO2: 29 mmol/L (ref 20–32)
Calcium: 10 mg/dL (ref 8.6–10.3)
Chloride: 97 mmol/L — ABNORMAL LOW (ref 98–110)
Creat: 1.09 mg/dL (ref 0.60–1.26)
Globulin: 3.2 g/dL (ref 1.9–3.7)
Glucose, Bld: 107 mg/dL — ABNORMAL HIGH (ref 65–99)
Potassium: 4 mmol/L (ref 3.5–5.3)
Sodium: 137 mmol/L (ref 135–146)
Total Bilirubin: 0.5 mg/dL (ref 0.2–1.2)
Total Protein: 8.3 g/dL — ABNORMAL HIGH (ref 6.1–8.1)
eGFR: 91 mL/min/{1.73_m2} (ref >=60)

## 2023-09-07 LAB — T-HELPER CELLS (CD4) COUNT (NOT AT ARMC)
Absolute CD4: 818 {cells}/uL (ref 490–1740)
CD4 T Helper %: 37 % (ref 30–61)
Total lymphocyte count: 2233 {cells}/uL (ref 850–3900)

## 2023-09-07 LAB — HIV-1 RNA QUANT-NO REFLEX-BLD
HIV 1 RNA Quant: NOT DETECTED {copies}/mL
HIV-1 RNA Quant, Log: NOT DETECTED {Log_copies}/mL

## 2023-12-10 ENCOUNTER — Other Ambulatory Visit: Payer: Self-pay

## 2023-12-10 ENCOUNTER — Ambulatory Visit: Payer: MEDICAID

## 2023-12-17 ENCOUNTER — Telehealth: Payer: Self-pay

## 2023-12-17 ENCOUNTER — Encounter: Payer: Self-pay | Admitting: Family

## 2023-12-17 ENCOUNTER — Other Ambulatory Visit (HOSPITAL_COMMUNITY): Payer: Self-pay

## 2023-12-17 ENCOUNTER — Other Ambulatory Visit: Payer: Self-pay

## 2023-12-17 ENCOUNTER — Ambulatory Visit: Payer: MEDICAID | Admitting: Family

## 2023-12-17 VITALS — BP 121/83 | HR 81 | Temp 97.5°F | Ht 72.0 in | Wt 213.0 lb

## 2023-12-17 DIAGNOSIS — F1120 Opioid dependence, uncomplicated: Secondary | ICD-10-CM

## 2023-12-17 DIAGNOSIS — R7989 Other specified abnormal findings of blood chemistry: Secondary | ICD-10-CM | POA: Diagnosis not present

## 2023-12-17 DIAGNOSIS — Z0000 Encounter for general adult medical examination without abnormal findings: Secondary | ICD-10-CM

## 2023-12-17 DIAGNOSIS — Z720 Tobacco use: Secondary | ICD-10-CM

## 2023-12-17 DIAGNOSIS — B20 Human immunodeficiency virus [HIV] disease: Secondary | ICD-10-CM | POA: Diagnosis not present

## 2023-12-17 MED ORDER — BIKTARVY 50-200-25 MG PO TABS: 1.0000 | ORAL_TABLET | Freq: Every day | ORAL | 5 refills | Status: DC

## 2023-12-17 NOTE — Assessment & Plan Note (Signed)
 Mr. Terlizzi continues to have well controlled virus with good adherence and tolerance to [place].  Reviewed lab work and discussed plan of care, U equals U, and family planning. Social determinants of health reviewed and no interventions indicated. Check lab work. Continue current dose of Biktarvy. Plan for follow up in  3 months or sooner if needed with lab work on the same day.Aaron Aas

## 2023-12-17 NOTE — Assessment & Plan Note (Signed)
 Elevated ALT on last lab work. Will recheck lab work and check for Hepatitis C in setting of drug use. Immune to Hepatitis B. Consider imaging if LFT remains elevated.

## 2023-12-17 NOTE — Telephone Encounter (Signed)
 [facility] Patient Advocate Encounter  Insurance verification completed.    The patient is insured through [employer] [place].     Ran test claim for BIKTARVY  The current 30 day co-pay is $0. Last Fill-12/12/23 Next Fill-01/04/24  Ran test claim for [facility]  The current 30 day co-pay is $0.   Ran test claim for DOVATO  The current 30 day co-pay is $0.    We will continue to follow to see if copay assistance is needed.  This test claim was processed through [facility]- copay amounts may vary at other pharmacies due to pharmacy/plan contracts, or as the patient moves through the different stages of their insurance plan.

## 2023-12-17 NOTE — Assessment & Plan Note (Signed)
 Counseled on the dangers of tobacco not ready to quit at this time.  Reviewed strategies to maximize success, including removing cigarettes and smoking materials from environment, stress management, substitution of other forms of reinforcement, support of family/friends, and written materials.

## 2023-12-17 NOTE — Patient Instructions (Addendum)
 Nice to see you.  We will check your lab work today.  Continue to take your medication daily as prescribed.  Refills have been sent to the pharmacy.  Plan for follow up in 3 months or sooner if needed with lab work on the same day.  Have a great day and stay safe!  Quitting Smoking Learn how quitting smoking can help prevent many health problems and improve your overall health. To view the content, go to this web address: https://pe.elsevier.com/sEVmc3LV  This video will expire on: 08/14/2025. If you need access to this video following this date, please reach out to the healthcare provider who assigned it to you. This information is not intended to replace advice given to you by your health care provider. Make sure you discuss any questions you have with your health care provider. Elsevier Patient Education  2024 [employer].

## 2023-12-17 NOTE — Assessment & Plan Note (Signed)
 Discussed importance of safe sexual practice and condom use. Condoms and site specific STD testing offered.  Vaccinations reviewed and up to date.  Dental care on going and up to date.

## 2023-12-17 NOTE — Assessment & Plan Note (Signed)
 Aaron Roman continues to use heroin. Counseled on dangers of continued use. Interested in quitting and considering rehabilitation following dental procedures. Discussed resources available for review.

## 2023-12-17 NOTE — Progress Notes (Signed)
 Brief Narrative   Patient ID: Aaron Roman, male    DOB: [birth year], 33 y.o.   MRN: [id number]  Aaron Roman is a 33 y/o AA male diagnosed with HIV disease on 12/09/21 with risk factor of history of drug use. Initial viral load 13,600 and CD4 count 561. Genotype with no significant medication resistant mutations. [id number] negative. No history of opportunistic infection. Enters care at [facility] Stage 1. Initial medication therapy with Biktarvy.    Subjective:    Chief Complaint  Patient presents with   Follow-up    B20    HPI:  Aaron Roman is a 33 y.o. male with HIV disease last seen on 09/05/23 with well-controlled virus and good adherence and tolerance to [employer].  Viral load was undetectable with CD4 count 818.  Kidney function and electrolytes within normal ranges.  ALT elevated at 51 with AST normal.  Here today for routine follow-up.  Aaron Roman has been doing well since his last office visit and continues to take [facility] as prescribed with no adverse side effects or problems obtaining medication from the pharmacy.  Covered by via health.  Continuing to use heroin daily.  Interested in rehabilitation after he gets his dental work completed which is hopefully in the next month.  Had question of elevated liver enzyme testing from previous.  Continues to smoke tobacco daily.  Condoms and site-specific STD testing offered.  Healthcare maintenance reviewed.  Routine dental care up-to-date per recommendations.  Housing, access to food, and transportation are stable.  Denies fevers, chills, night sweats, headaches, changes in vision, neck pain/stiffness, nausea, diarrhea, vomiting, lesions or rashes.  Lab Results  Component Value Date   CD4TCELL 37 09/05/2023   CD4TABS 865 02/19/2023   Lab Results  Component Value Date   HIV1RNAQUANT Not Detected 09/05/2023     Allergies  Allergen Reactions   Codeine Nausea And Vomiting   Pollen Extract Itching      Outpatient Medications Prior to  Visit  Medication Sig Dispense Refill   Acetaminophen (TYLENOL PO) Take by mouth.     IBUPROFEN PO Take by mouth.     bictegravir-emtricitabine-tenofovir AF (BIKTARVY) 50-200-25 MG TABS tablet Take 1 tablet by mouth daily. 30 tablet 5   No facility-administered medications prior to visit.     Past Medical History:  Diagnosis Date   Chronic abdominal pain    Chronic diarrhea    Cyclical vomiting with nausea    GERD (gastroesophageal reflux disease)      Past Surgical History:  Procedure Laterality Date   COLONOSCOPY     COLONOSCOPY N/A 02/04/2015   Procedure: COLONOSCOPY;  Surgeon: Malissa Hippo, MD;  [facility];  Service: Endoscopy;  Laterality: N/A;  145 - moved to 7:30 - Ann notified pt   ESOPHAGOGASTRODUODENOSCOPY N/A 04/14/2014   Procedure: ESOPHAGOGASTRODUODENOSCOPY (EGD);  Surgeon: Malissa Hippo, MD;  [facility];  Service: Endoscopy;  Laterality: N/A;  830   ESOPHAGOGASTRODUODENOSCOPY N/A 02/04/2015   Procedure: ESOPHAGOGASTRODUODENOSCOPY (EGD);  Surgeon: Malissa Hippo, MD;  [facility];  Service: Endoscopy;  Laterality: N/A;   WISDOM TOOTH EXTRACTION        Review of Systems  Constitutional:  Negative for appetite change, chills, fatigue, fever and unexpected weight change.  Eyes:  Negative for visual disturbance.  Respiratory:  Negative for cough, chest tightness, shortness of breath and wheezing.   Cardiovascular:  Negative for chest pain and leg swelling.  Gastrointestinal:  Negative for  abdominal pain, constipation, diarrhea, nausea and vomiting.  Genitourinary:  Negative for dysuria, flank pain, frequency, genital sores, hematuria and urgency.  Skin:  Negative for rash.  Allergic/Immunologic: Negative for immunocompromised state.  Neurological:  Negative for dizziness and headaches.      Objective:    BP 121/83   Pulse 81   Temp (!) 97.5 F (36.4 C) (Temporal)   Ht 6' (1.829 m)   Wt 213 lb (96.6 kg)   SpO2 98%    BMI 28.89 kg/m  Nursing note and vital signs reviewed.  Physical Exam Constitutional:      General: He is not in acute distress.    Appearance: He is well-developed.  Eyes:     Conjunctiva/sclera: Conjunctivae normal.  Cardiovascular:     Rate and Rhythm: Normal rate and regular rhythm.     Heart sounds: Normal heart sounds. No murmur heard.    No friction rub. No gallop.  Pulmonary:     Effort: Pulmonary effort is normal. No respiratory distress.     Breath sounds: Normal breath sounds. No wheezing or rales.  Chest:     Chest wall: No tenderness.  Abdominal:     General: Bowel sounds are normal.     Palpations: Abdomen is soft.     Tenderness: There is no abdominal tenderness.  Musculoskeletal:     Cervical back: Neck supple.  Lymphadenopathy:     Cervical: No cervical adenopathy.  Skin:    General: Skin is warm and dry.     Findings: No rash.  Neurological:     Mental Status: He is alert and oriented to person, place, and time.  Psychiatric:        Behavior: Behavior normal.        Thought Content: Thought content normal.        Judgment: Judgment normal.         12/17/2023    2:05 PM 09/05/2023   12:57 PM 02/19/2023   10:42 AM 07/06/2022   11:14 AM 03/15/2022    1:49 PM  Depression screen PHQ 2/9  Decreased Interest 0 0 0 0 0  Down, Depressed, Hopeless 0 0 0 0 0  PHQ - 2 Score 0 0 0 0 0  Altered sleeping 3      Tired, decreased energy --      Change in appetite 1      Feeling bad or failure about yourself  1      Trouble concentrating 0      Moving slowly or fidgety/restless 0      Suicidal thoughts 0      PHQ-9 Score 5      Difficult doing work/chores Somewhat difficult            12/17/2023    2:06 PM  GAD 7 : Generalized Anxiety Score  Nervous, Anxious, on Edge 0  Control/stop worrying 0  Worry too much - different things 0  Trouble relaxing 1  Restless 0  Easily annoyed or irritable 0  Afraid - awful might happen 0  Total GAD 7 Score 1   Anxiety Difficulty Not difficult at all     The ASCVD Risk score (Arnett DK, et al., 2019) failed to calculate for the following reasons:   The 2019 ASCVD risk score is only valid for ages 20 to 60      Assessment & Plan:    Patient Active Problem List   Diagnosis Date Noted   Nausea and vomiting 04/09/2014  Priority: 15.   Elevated LFTs 12/17/2023   Heroin addiction (HCC) 09/05/2023   Tobacco use 10/22/2022   Healthcare maintenance 03/15/2022   HIV disease (HCC) 12/20/2021   Chest pain 03/02/2015   Abdominal pain, epigastric 04/09/2014     Problem List Items Addressed This Visit       Other   HIV disease (HCC) - Primary   Aaron Roman continues to have well controlled virus with good adherence and tolerance to [employer].  Reviewed lab work and discussed plan of care, U equals U, and family planning. Social determinants of health reviewed and no interventions indicated. Check lab work. Continue current dose of Biktarvy. Plan for follow up in  3 months or sooner if needed with lab work on the same day..       Relevant Medications   bictegravir-emtricitabine-tenofovir AF (BIKTARVY) 50-200-25 MG TABS tablet   Other Relevant Orders   T-helper cell (CD4)- ([facility] clinic only)   HIV-1 RNA quant-no reflex-bld   Healthcare maintenance   Discussed importance of safe sexual practice and condom use. Condoms and site specific STD testing offered.  Vaccinations reviewed and up to date.  Dental care on going and up to date.       Tobacco use   Counseled on the dangers of tobacco not ready to quit at this time.  Reviewed strategies to maximize success, including removing cigarettes and smoking materials from environment, stress management, substitution of other forms of reinforcement, support of family/friends, and written materials.        Heroin addiction [facility])   Aaron Roman continues to use heroin. Counseled on dangers of continued use. Interested in quitting and considering  rehabilitation following dental procedures. Discussed resources available for review.       Elevated LFTs   Elevated ALT on last lab work. Will recheck lab work and check for Hepatitis C in setting of drug use. Immune to Hepatitis B. Consider imaging if LFT remains elevated.       Relevant Orders   Hepatic function panel   Hepatitis C antibody     I am having Aaron Roman maintain his Acetaminophen (TYLENOL PO), IBUPROFEN PO, and Biktarvy.   Meds ordered this encounter  Medications   bictegravir-emtricitabine-tenofovir AF (BIKTARVY) 50-200-25 MG TABS tablet    Sig: Take 1 tablet by mouth daily.    Dispense:  30 tablet    Refill:  5    Please mail    Supervising Provider:   Liane Redman [phone]    Prescription Type::   Renewal     Follow-up: Return in about 3 months (around 03/17/2024). or sooner if needed.    Aaron Silva, MSN, FNP-C Nurse Practitioner [facility] for Infectious Disease [facility] Main number: [phone]

## 2023-12-18 LAB — T-HELPER CELL (CD4) - (RCID CLINIC ONLY)
CD4 % Helper T Cell: 40 % (ref 33–65)
CD4 T Cell Abs: 753 /uL (ref 400–1790)

## 2023-12-19 LAB — HEPATITIS C ANTIBODY: Hepatitis C Ab: NONREACTIVE

## 2023-12-19 LAB — HEPATIC FUNCTION PANEL
AG Ratio: 2 (calc) (ref 1.0–2.5)
ALT: 40 U/L (ref 9–46)
AST: 24 U/L (ref 10–40)
Albumin: 5.1 g/dL (ref 3.6–5.1)
Alkaline phosphatase (APISO): 139 U/L — ABNORMAL HIGH (ref 36–130)
Bilirubin, Direct: 0.1 mg/dL (ref 0.0–0.2)
Globulin: 2.6 g/dL (ref 1.9–3.7)
Indirect Bilirubin: 0.3 mg/dL (ref 0.2–1.2)
Total Bilirubin: 0.4 mg/dL (ref 0.2–1.2)
Total Protein: 7.7 g/dL (ref 6.1–8.1)

## 2023-12-19 LAB — HIV-1 RNA QUANT-NO REFLEX-BLD
HIV 1 RNA Quant: NOT DETECTED {copies}/mL
HIV-1 RNA Quant, Log: NOT DETECTED {Log_copies}/mL

## 2024-02-06 ENCOUNTER — Ambulatory Visit: Payer: MEDICAID

## 2024-02-18 ENCOUNTER — Ambulatory Visit: Payer: MEDICAID

## 2024-02-18 ENCOUNTER — Other Ambulatory Visit: Payer: Self-pay

## 2024-03-17 ENCOUNTER — Ambulatory Visit: Payer: MEDICAID | Admitting: Family

## 2024-03-17 ENCOUNTER — Other Ambulatory Visit (HOSPITAL_COMMUNITY): Payer: Self-pay

## 2024-03-17 ENCOUNTER — Other Ambulatory Visit: Payer: Self-pay

## 2024-03-17 ENCOUNTER — Encounter: Payer: Self-pay | Admitting: Family

## 2024-03-17 VITALS — BP 121/83 | HR 83 | Temp 98.2°F | Ht 72.0 in | Wt 212.2 lb

## 2024-03-17 DIAGNOSIS — B20 Human immunodeficiency virus [HIV] disease: Secondary | ICD-10-CM | POA: Diagnosis not present

## 2024-03-17 DIAGNOSIS — Z0000 Encounter for general adult medical examination without abnormal findings: Secondary | ICD-10-CM

## 2024-03-17 DIAGNOSIS — Z720 Tobacco use: Secondary | ICD-10-CM

## 2024-03-17 DIAGNOSIS — L02611 Cutaneous abscess of right foot: Secondary | ICD-10-CM

## 2024-03-17 DIAGNOSIS — F17210 Nicotine dependence, cigarettes, uncomplicated: Secondary | ICD-10-CM | POA: Diagnosis not present

## 2024-03-17 DIAGNOSIS — Z113 Encounter for screening for infections with a predominantly sexual mode of transmission: Secondary | ICD-10-CM

## 2024-03-17 DIAGNOSIS — F1120 Opioid dependence, uncomplicated: Secondary | ICD-10-CM | POA: Diagnosis not present

## 2024-03-17 MED ORDER — BIKTARVY 50-200-25 MG PO TABS: 1.0000 | ORAL_TABLET | Freq: Every day | ORAL | 5 refills | Status: DC

## 2024-03-17 MED ORDER — LINEZOLID 600 MG PO TABS
600.0000 mg | ORAL_TABLET | Freq: Two times a day (BID) | ORAL | 0 refills | Status: DC
Start: 2024-03-17 — End: 2024-06-09

## 2024-03-17 NOTE — Assessment & Plan Note (Signed)
 Continues to use heroin regularly.  Discussed risks of continued use and detriments to health.  Working on finding a rehab facility.

## 2024-03-17 NOTE — Progress Notes (Signed)
 Brief Narrative   Patient ID: Aaron Roman, male    DOB: Jan 08, 1991, 33 y.o.   MRN: [id number]  Aaron Roman is a 33 y/o AA male diagnosed with HIV disease on 12/09/21 with risk factor of history of drug use. Initial viral load 13,600 and CD4 count 561. Genotype with no significant medication resistant mutations. HLAB5701 negative. No history of opportunistic infection. Enters care at [facility] Stage 1. Initial medication therapy with Biktarvy .    Subjective:   Chief Complaint  Patient presents with   Follow-up    2-3 days RT foot swelling. Samples for BIk    HPI:  Aaron Roman is a 33 y.o. male with HIV disease last seen on 12/17/23 with well controlled virus and good adherence and tolerance to Biktarvy . Viral load was undetectable and CD4 count 753. Kidney function, liver function and electrolytes within normal ranges. Hepatitis C antibody negative. Here today for follow up.   Sister is present for today's visit. Aaron Roman has been doing okay since his last office visit and continues to take Biktarvy  as prescribed with no adverse side effects. Covered by [facility]. Has had some challenges getting medication recently. Housing, transportation, and access to food are stable. Not currently sexually active. Continues to use heroin (snorting)Healthcare maintenance reviewed. Dental care up to date.   Has concern about acute onset right foot swelling starting about 4 days ago when he noticed boil/abscess between his 3rd and 4th toes that has since popped about 1-2 days ago and now with mild right foot edema without tenderness. Denies trauma, injury, fevers or chills. Course has remained the same over the last couple of days and has treated it with soap/water  and antibiotic cream.   Denies fevers, chills, night sweats, headaches, changes in vision, neck pain/stiffness, nausea, diarrhea, vomiting, lesions or rashes.  Lab Results  Component Value Date   CD4TCELL 40 12/17/2023   CD4TABS 753 12/17/2023    Lab Results  Component Value Date   HIV1RNAQUANT NOT DETECTED 12/17/2023     Allergies  Allergen Reactions   Codeine Nausea And Vomiting   Pollen Extract Itching      Outpatient Medications Prior to Visit  Medication Sig Dispense Refill   Acetaminophen  (TYLENOL  PO) Take by mouth.     IBUPROFEN  PO Take by mouth.     bictegravir-emtricitabine-tenofovir AF (BIKTARVY ) 50-200-25 MG TABS tablet Take 1 tablet by mouth daily. 30 tablet 5   No facility-administered medications prior to visit.     Past Medical History:  Diagnosis Date   Chronic abdominal pain    Chronic diarrhea    Cyclical vomiting with nausea    GERD (gastroesophageal reflux disease)      Past Surgical History:  Procedure Laterality Date   COLONOSCOPY     COLONOSCOPY N/A 02/04/2015   Procedure: COLONOSCOPY;  Surgeon: Claudis RAYMOND Rivet, MD;  [facility];  Service: Endoscopy;  Laterality: N/A;  145 - moved to 7:30 - Ann notified pt   ESOPHAGOGASTRODUODENOSCOPY N/A 04/14/2014   Procedure: ESOPHAGOGASTRODUODENOSCOPY (EGD);  Surgeon: Claudis RAYMOND Rivet, MD;  [facility];  Service: Endoscopy;  Laterality: N/A;  830   ESOPHAGOGASTRODUODENOSCOPY N/A 02/04/2015   Procedure: ESOPHAGOGASTRODUODENOSCOPY (EGD);  Surgeon: Claudis RAYMOND Rivet, MD;  [facility];  Service: Endoscopy;  Laterality: N/A;   WISDOM TOOTH EXTRACTION       Review of Systems  Constitutional:  Negative for appetite change, chills, fatigue, fever and unexpected weight change.  Eyes:  Negative for visual disturbance.  Respiratory:  Negative for cough, chest tightness, shortness of breath and wheezing.   Cardiovascular:  Negative for chest pain and leg swelling.  Gastrointestinal:  Negative for abdominal pain, constipation, diarrhea, nausea and vomiting.  Genitourinary:  Negative for dysuria, flank pain, frequency, genital sores, hematuria and urgency.  Musculoskeletal:        Right foot swelling  Skin:  Negative for rash.   Allergic/Immunologic: Negative for immunocompromised state.  Neurological:  Negative for dizziness and headaches.     Objective:   BP 121/83   Pulse 83   Temp 98.2 F (36.8 C) (Oral)   Ht 6' (1.829 m)   Wt 212 lb 3.2 oz (96.3 kg)   SpO2 96%   BMI 28.78 kg/m  Nursing note and vital signs reviewed.  Physical Exam Constitutional:      General: He is not in acute distress.    Appearance: He is well-developed.  Eyes:     Conjunctiva/sclera: Conjunctivae normal.  Cardiovascular:     Rate and Rhythm: Normal rate and regular rhythm.     Heart sounds: Normal heart sounds. No murmur heard.    No friction rub. No gallop.  Pulmonary:     Effort: Pulmonary effort is normal. No respiratory distress.     Breath sounds: Normal breath sounds. No wheezing or rales.  Chest:     Chest wall: No tenderness.  Abdominal:     General: Bowel sounds are normal.     Palpations: Abdomen is soft.     Tenderness: There is no abdominal tenderness.  Musculoskeletal:     Cervical back: Neck supple.     Comments: Right foot with mild edema and small scab with very scant drainage between the 3rd/4th toes. No pain or induration.   Lymphadenopathy:     Cervical: No cervical adenopathy.  Skin:    General: Skin is warm and dry.     Findings: No rash.  Neurological:     Mental Status: He is alert and oriented to person, place, and time.  Psychiatric:        Mood and Affect: Mood normal.          03/17/2024    1:58 PM 12/17/2023    2:05 PM 09/05/2023   12:57 PM 02/19/2023   10:42 AM 07/06/2022   11:14 AM  Depression screen PHQ 2/9  Decreased Interest 0 0 0 0 0  Down, Depressed, Hopeless 1 0 0 0 0  PHQ - 2 Score 1 0 0 0 0  Altered sleeping 3 3     Tired, decreased energy 0 --     Change in appetite 0 1     Feeling bad or failure about yourself  2 1     Trouble concentrating 0 0     Moving slowly or fidgety/restless 0 0     Suicidal thoughts 0 0     PHQ-9 Score 6 5     Difficult doing  work/chores Somewhat difficult Somewhat difficult           03/17/2024    1:59 PM 12/17/2023    2:06 PM  GAD 7 : Generalized Anxiety Score  Nervous, Anxious, on Edge 1 0  Control/stop worrying 1 0  Worry too much - different things 1 0  Trouble relaxing 1 1  Restless 1 0  Easily annoyed or irritable 0 0  Afraid - awful might happen 0 0  Total GAD 7 Score 5 1  Anxiety  Difficulty Somewhat difficult Not difficult at all        Assessment & Plan:    Patient Active Problem List   Diagnosis Date Noted   Nausea and vomiting 04/09/2014    Priority: 15.   Foot abscess, right 03/17/2024   Elevated LFTs 12/17/2023   Heroin addiction (HCC) 09/05/2023   Tobacco use 10/22/2022   Healthcare maintenance 03/15/2022   HIV disease (HCC) 12/20/2021   Chest pain 03/02/2015   Abdominal pain, epigastric 04/09/2014     Problem List Items Addressed This Visit       Other   HIV disease (HCC) - Primary   Aaron Roman continues to have well-controlled virus with good adherence and tolerance to Biktarvy .  Reviewed previous lab work and discussed plan of care.  Covered by [facility] and discussed options if difficulties continue. Social determinants of health reviewed with no interventions indicated.  Check lab work.  Sample of Biktarvy  provided.  Continue current dose of Biktarvy .  Plan for follow-up in 6 months or sooner if needed with lab work on the same day.      Relevant Medications   bictegravir-emtricitabine-tenofovir AF (BIKTARVY ) 50-200-25 MG TABS tablet   linezolid  (ZYVOX ) 600 MG tablet   Other Relevant Orders   Comprehensive metabolic panel with GFR   HIV-1 RNA quant-no reflex-bld   T-helper cell (CD4)- ([facility] clinic only)   Healthcare maintenance   Discussed importance of safe sexual practice and condom use. Condoms and site specific STD testing offered.  Vaccinations reviewed and declined following counseling. Dental care up to date.       Tobacco use   Continues to smoke  tobacco daily. Counseled on the dangers of tobacco not ready to quit at this time.  Reviewed strategies to maximize success, including removing cigarettes and smoking materials from environment, stress management, substitution of other forms of reinforcement, support of family/friends, and written materials.        Heroin addiction (HCC)   Continues to use heroin regularly.  Discussed risks of continued use and detriments to health.  Working on finding a rehab facility.      Foot abscess, right   Aaron Roman has residual right foot abscess that spontaneously drained with some residual foot swelling. No induration. Continue basic wound care and will start linezolid  600 mg PO bid for 7 days for any residual infection. Follow up if symptoms worsen or do not improve.       Other Visit Diagnoses       Screening for STDs (sexually transmitted diseases)       Relevant Orders   RPR        I am having Aaron Roman start on linezolid . I am also having him maintain his Acetaminophen  (TYLENOL  PO), IBUPROFEN  PO, and Biktarvy .   Meds ordered this encounter  Medications   bictegravir-emtricitabine-tenofovir AF (BIKTARVY ) 50-200-25 MG TABS tablet    Sig: Take 1 tablet by mouth daily.    Dispense:  30 tablet    Refill:  5    Supervising Provider:   SNIDER, CYNTHIA [id number]    Prescription Type::   Renewal   linezolid  (ZYVOX ) 600 MG tablet    Sig: Take 1 tablet (600 mg total) by mouth 2 (two) times daily.    Dispense:  14 tablet    Refill:  0    Supervising Provider:   LUIZ CHANNEL [[id number]]     Follow-up: Return in about 6 months (around 09/17/2024). or sooner if needed.  Cathlyn July, MSN, FNP-C Nurse Practitioner [facility] for Infectious Disease [facility] Main number: [phone]

## 2024-03-17 NOTE — Patient Instructions (Signed)
 Nice to see you.  We will check your lab work today.  Cleanse your foot with soap and water . Epsom salt soaks okay. Take antibiotic.  Continue to take your medication daily as prescribed.  Refills have been sent to the pharmacy.  Plan for follow up in 6 months or sooner if needed with lab work on the same day.  Have a great day and stay safe!

## 2024-03-17 NOTE — Assessment & Plan Note (Signed)
 Continues to smoke tobacco daily. Counseled on the dangers of tobacco not ready to quit at this time.  Reviewed strategies to maximize success, including removing cigarettes and smoking materials from environment, stress management, substitution of other forms of reinforcement, support of family/friends, and written materials.

## 2024-03-17 NOTE — Assessment & Plan Note (Signed)
 Aaron Roman continues to have well-controlled virus with good adherence and tolerance to Biktarvy .  Reviewed previous lab work and discussed plan of care.  Covered by [facility] and discussed options if difficulties continue. Social determinants of health reviewed with no interventions indicated.  Check lab work.  Sample of Biktarvy  provided.  Continue current dose of Biktarvy .  Plan for follow-up in 6 months or sooner if needed with lab work on the same day.

## 2024-03-17 NOTE — Assessment & Plan Note (Signed)
 Discussed importance of safe sexual practice and condom use. Condoms and site specific STD testing offered.  Vaccinations reviewed and declined following counseling. Dental care up to date.

## 2024-03-17 NOTE — Assessment & Plan Note (Signed)
 Aaron Roman has residual right foot abscess that spontaneously drained with some residual foot swelling. No induration. Continue basic wound care and will start linezolid  600 mg PO bid for 7 days for any residual infection. Follow up if symptoms worsen or do not improve.

## 2024-03-18 LAB — T-HELPER CELL (CD4) - (RCID CLINIC ONLY)
CD4 % Helper T Cell: 41 % (ref 33–65)
CD4 T Cell Abs: 795 /uL (ref 400–1790)

## 2024-03-19 LAB — HIV-1 RNA QUANT-NO REFLEX-BLD
HIV 1 RNA Quant: NOT DETECTED {copies}/mL
HIV-1 RNA Quant, Log: NOT DETECTED {Log_copies}/mL

## 2024-03-19 LAB — COMPREHENSIVE METABOLIC PANEL WITH GFR
AG Ratio: 1.7 (calc) (ref 1.0–2.5)
ALT: 73 U/L — ABNORMAL HIGH (ref 9–46)
AST: 43 U/L — ABNORMAL HIGH (ref 10–40)
Albumin: 4.2 g/dL (ref 3.6–5.1)
Alkaline phosphatase (APISO): 115 U/L (ref 36–130)
BUN/Creatinine Ratio: 6 (calc) (ref 6–22)
BUN: 6 mg/dL — ABNORMAL LOW (ref 7–25)
CO2: 29 mmol/L (ref 20–32)
Calcium: 9.1 mg/dL (ref 8.6–10.3)
Chloride: 99 mmol/L (ref 98–110)
Creat: 1.09 mg/dL (ref 0.60–1.26)
Globulin: 2.5 g/dL (ref 1.9–3.7)
Glucose, Bld: 173 mg/dL — ABNORMAL HIGH (ref 65–99)
Potassium: 4.1 mmol/L (ref 3.5–5.3)
Sodium: 136 mmol/L (ref 135–146)
Total Bilirubin: 0.3 mg/dL (ref 0.2–1.2)
Total Protein: 6.7 g/dL (ref 6.1–8.1)
eGFR: 91 mL/min/1.73m2 (ref >=60)

## 2024-03-19 LAB — RPR: RPR Ser Ql: NONREACTIVE

## 2024-03-20 ENCOUNTER — Ambulatory Visit: Payer: Self-pay | Admitting: Family

## 2024-03-20 ENCOUNTER — Other Ambulatory Visit: Payer: Self-pay | Admitting: Pharmacist

## 2024-03-20 DIAGNOSIS — B20 Human immunodeficiency virus [HIV] disease: Secondary | ICD-10-CM

## 2024-03-20 MED ORDER — BICTEGRAVIR-EMTRICITAB-TENOFOV 50-200-25 MG PO TABS: 1.0000 | ORAL_TABLET | Freq: Every day | ORAL | Status: AC

## 2024-03-20 NOTE — Progress Notes (Signed)
 Medication Samples have been provided to the patient.  Drug name: Biktarvy         Strength: 50/200/25 mg       Qty: 7 tablets (1 bottles) LOT: CTGMDA   Exp.Date: 7/27  Samples requested by Marlan Silva, NP.  Dosing instructions: Take one tablet by mouth once daily  The patient has been instructed regarding the correct time, dose, and frequency of taking this medication, including desired effects and most common side effects.   Nicklas Barns, PharmD, CPP, BCIDP, AAHIVP Clinical Pharmacist Practitioner Infectious Diseases Clinical Pharmacist [facility] for Infectious Disease

## 2024-04-13 ENCOUNTER — Other Ambulatory Visit: Payer: Self-pay | Admitting: Family

## 2024-04-21 ENCOUNTER — Other Ambulatory Visit: Payer: Self-pay | Admitting: Family

## 2024-04-21 DIAGNOSIS — B20 Human immunodeficiency virus [HIV] disease: Secondary | ICD-10-CM

## 2024-06-09 ENCOUNTER — Encounter: Payer: Self-pay | Admitting: Family

## 2024-06-09 ENCOUNTER — Other Ambulatory Visit (HOSPITAL_COMMUNITY): Payer: Self-pay

## 2024-06-09 ENCOUNTER — Other Ambulatory Visit: Payer: Self-pay

## 2024-06-09 ENCOUNTER — Ambulatory Visit: Payer: MEDICAID | Admitting: Family

## 2024-06-09 ENCOUNTER — Other Ambulatory Visit: Payer: Self-pay | Admitting: Pharmacist

## 2024-06-09 VITALS — BP 120/82 | HR 90 | Temp 98.1°F | Wt 193.0 lb

## 2024-06-09 DIAGNOSIS — B20 Human immunodeficiency virus [HIV] disease: Secondary | ICD-10-CM | POA: Diagnosis not present

## 2024-06-09 DIAGNOSIS — F17210 Nicotine dependence, cigarettes, uncomplicated: Secondary | ICD-10-CM

## 2024-06-09 DIAGNOSIS — F1120 Opioid dependence, uncomplicated: Secondary | ICD-10-CM | POA: Diagnosis not present

## 2024-06-09 DIAGNOSIS — Z113 Encounter for screening for infections with a predominantly sexual mode of transmission: Secondary | ICD-10-CM

## 2024-06-09 DIAGNOSIS — Z0000 Encounter for general adult medical examination without abnormal findings: Secondary | ICD-10-CM

## 2024-06-09 DIAGNOSIS — Z720 Tobacco use: Secondary | ICD-10-CM

## 2024-06-09 MED ORDER — BIKTARVY 50-200-25 MG PO TABS
1.0000 | ORAL_TABLET | Freq: Every day | ORAL | 5 refills | Status: AC
  Filled 2024-06-09: qty 30, 30d supply, fill #0
  Filled 2024-07-08: qty 30, 30d supply, fill #1
  Filled 2024-08-07: qty 30, 30d supply, fill #2
  Filled 2024-09-01: qty 30, 30d supply, fill #3
  Filled 2024-09-30: qty 30, 30d supply, fill #4

## 2024-06-09 NOTE — Assessment & Plan Note (Signed)
 Discussed importance of safe sexual practice and condom use. Condoms and site specific STD testing offered.  Vaccinations reviewed declined following counseling.  Due for dental care which he will schedule

## 2024-06-09 NOTE — Progress Notes (Signed)
 Specialty Pharmacy Initiation Note   Aaron Roman is a 33 y.o. male who will be followed by the specialty pharmacy service for RxSp HIV    Review of administration, indication, effectiveness, safety, potential side effects, storage/disposable, and missed dose instructions occurred today for patient's specialty medication(s) Bictegravir-Emtricitab-Tenofov (Biktarvy )     Patient/Caregiver did not have any additional questions or concerns.   Patient's therapy is appropriate to: Continue    Goals Addressed             This Visit's Progress    Comply with lab assessments       Patient is on track. Patient will adhere to provider and/or lab appointments      Increase CD4 count until steady state       Patient is on track. Patient will maintain adherence      Maintain optimal adherence to therapy       Patient is on track. Patient will maintain adherence         Alan JINNY Geralds Specialty Pharmacist

## 2024-06-09 NOTE — Assessment & Plan Note (Signed)
 Mr. Rhue appears to have adequately controlled virus with good adherence and tolerance to Biktarvy .  Reviewed previous lab work and discussed plan of care and U equals U.  Covered by [facility].  Have sent a prescription to [facility] pharmacy for mailing.  Social determinants of health reviewed with no interventions indicated.  Check blood work.  Continue current dose of Biktarvy .  Plan for follow-up in 4 months or sooner if needed with lab work on same day.

## 2024-06-09 NOTE — Progress Notes (Signed)
 Brief Narrative   Patient ID: Aaron Roman, male    DOB: 1991-04-23, 33 y.o.   MRN: [id number]  Aaron Roman is a 33 y/o AA male diagnosed with HIV disease on 12/09/21 with risk factor of history of drug use. Initial viral load 13,600 and CD4 count 561. Genotype with no significant medication resistant mutations. HLAB5701 negative. No history of opportunistic infection. Enters care at [facility] Stage 1. Initial medication therapy with Biktarvy .    Subjective:   Chief Complaint  Patient presents with   Follow-up    B20    HPI:  Aaron Roman is a 33 y.o. male with HIV disease last seen on 03/17/2024 with well-controlled virus and good adherence and tolerance to Biktarvy .  Viral load was undetectable with CD4 count 795.  Kidney function and electrolytes within normal ranges.  Liver function tests elevated with AST 43 and ALT 73.  Here today for follow-up.  Aaron Roman has been doing okay since his last office visit and continues to take Biktarvy  as prescribed with no adverse side effects.  Has had some issues obtaining medication from the pharmacy through mail-order service and requesting change in mail order.  Did miss about a week of medication.  No new concerns/complaints.  Housing and access to food are stable.  Transportation is as needed.  Continues to use heroin daily.  Healthcare maintenance reviewed.  Condoms and site-specific STD testing offered.   Denies fevers, chills, night sweats, headaches, changes in vision, neck pain/stiffness, nausea, diarrhea, vomiting, lesions or rashes.  Lab Results  Component Value Date   CD4TCELL 41 03/17/2024   CD4TABS 795 03/17/2024   Lab Results  Component Value Date   HIV1RNAQUANT NOT DETECTED 03/17/2024     Allergies  Allergen Reactions   Codeine Nausea And Vomiting   Pollen Extract Itching      Outpatient Medications Prior to Visit  Medication Sig Dispense Refill   Acetaminophen  (TYLENOL  PO) Take by mouth.     IBUPROFEN  PO Take by mouth.      bictegravir-emtricitabine-tenofovir AF (BIKTARVY ) 50-200-25 MG TABS tablet Take 1 tablet by mouth daily. 30 tablet 5   linezolid  (ZYVOX ) 600 MG tablet Take 1 tablet (600 mg total) by mouth 2 (two) times daily. 14 tablet 0   No facility-administered medications prior to visit.     Past Medical History:  Diagnosis Date   Chronic abdominal pain    Chronic diarrhea    Cyclical vomiting with nausea    GERD (gastroesophageal reflux disease)      Past Surgical History:  Procedure Laterality Date   COLONOSCOPY     COLONOSCOPY N/A 02/04/2015   Procedure: COLONOSCOPY;  Surgeon: Aaron RAYMOND Rivet, MD;  [facility];  Service: Endoscopy;  Laterality: N/A;  145 - moved to 7:30 - Ann notified pt   ESOPHAGOGASTRODUODENOSCOPY N/A 04/14/2014   Procedure: ESOPHAGOGASTRODUODENOSCOPY (EGD);  Surgeon: Aaron RAYMOND Rivet, MD;  [facility];  Service: Endoscopy;  Laterality: N/A;  830   ESOPHAGOGASTRODUODENOSCOPY N/A 02/04/2015   Procedure: ESOPHAGOGASTRODUODENOSCOPY (EGD);  Surgeon: Aaron RAYMOND Rivet, MD;  [facility];  Service: Endoscopy;  Laterality: N/A;   WISDOM TOOTH EXTRACTION          Review of Systems  Constitutional:  Negative for appetite change, chills, fatigue, fever and unexpected weight change.  Eyes:  Negative for visual disturbance.  Respiratory:  Negative for cough, chest tightness, shortness of breath and wheezing.   Cardiovascular:  Negative for chest pain  and leg swelling.  Gastrointestinal:  Negative for abdominal pain, constipation, diarrhea, nausea and vomiting.  Genitourinary:  Negative for dysuria, flank pain, frequency, genital sores, hematuria and urgency.  Skin:  Negative for rash.  Allergic/Immunologic: Negative for immunocompromised state.  Neurological:  Negative for dizziness and headaches.     Objective:   BP 120/82   Pulse 90   Temp 98.1 F (36.7 C) (Oral)   Wt 193 lb (87.5 kg)   SpO2 99%   BMI 26.18 kg/m  Nursing note and  vital signs reviewed.  Physical Exam Constitutional:      General: He is not in acute distress.    Appearance: He is well-developed.  Eyes:     Conjunctiva/sclera: Conjunctivae normal.  Cardiovascular:     Rate and Rhythm: Normal rate and regular rhythm.     Heart sounds: Normal heart sounds. No murmur heard.    No friction rub. No gallop.  Pulmonary:     Effort: Pulmonary effort is normal. No respiratory distress.     Breath sounds: Normal breath sounds. No wheezing or rales.  Chest:     Chest wall: No tenderness.  Abdominal:     General: Bowel sounds are normal.     Palpations: Abdomen is soft.     Tenderness: There is no abdominal tenderness.  Musculoskeletal:     Cervical back: Neck supple.  Lymphadenopathy:     Cervical: No cervical adenopathy.  Skin:    General: Skin is warm and dry.     Findings: No rash.  Neurological:     Mental Status: He is alert and oriented to person, place, and time.  Psychiatric:        Speech: Speech is tangential.        Behavior: Behavior is cooperative.     Comments: Limited eye contact, talkative          03/17/2024    1:58 PM 12/17/2023    2:05 PM 09/05/2023   12:57 PM 02/19/2023   10:42 AM 07/06/2022   11:14 AM  Depression screen PHQ 2/9  Decreased Interest 0 0 0 0 0  Down, Depressed, Hopeless 1 0 0 0 0  PHQ - 2 Score 1 0 0 0 0  Altered sleeping 3 3     Tired, decreased energy 0 --     Change in appetite 0 1     Feeling bad or failure about yourself  2 1     Trouble concentrating 0 0     Moving slowly or fidgety/restless 0 0     Suicidal thoughts 0 0     PHQ-9 Score 6 5     Difficult doing work/chores Somewhat difficult Somewhat difficult           03/17/2024    1:59 PM 12/17/2023    2:06 PM  GAD 7 : Generalized Anxiety Score  Nervous, Anxious, on Edge 1 0  Control/stop worrying 1 0  Worry too much - different things 1 0  Trouble relaxing 1 1  Restless 1 0  Easily annoyed or irritable 0 0  Afraid - awful might  happen 0 0  Total GAD 7 Score 5 1  Anxiety Difficulty Somewhat difficult Not difficult at all     The ASCVD Risk score (Arnett DK, et al., 2019) failed to calculate for the following reasons:   The 2019 ASCVD risk score is only valid for ages 65 to 34      Assessment & Plan:  Patient Active Problem List   Diagnosis Date Noted   Nausea and vomiting 04/09/2014    Priority: 15.   Foot abscess, right 03/17/2024   Elevated LFTs 12/17/2023   Heroin addiction (HCC) 09/05/2023   Tobacco use 10/22/2022   Healthcare maintenance 03/15/2022   HIV disease (HCC) 12/20/2021   Chest pain 03/02/2015   Abdominal pain, epigastric 04/09/2014     Problem List Items Addressed This Visit       Other   HIV disease (HCC) - Primary   Mr. Voong appears to have adequately controlled virus with good adherence and tolerance to Biktarvy .  Reviewed previous lab work and discussed plan of care and U equals U.  Covered by [facility].  Have sent a prescription to [facility] pharmacy for mailing.  Social determinants of health reviewed with no interventions indicated.  Check blood work.  Continue current dose of Biktarvy .  Plan for follow-up in 4 months or sooner if needed with lab work on same day.      Relevant Medications   bictegravir-emtricitabine-tenofovir AF (BIKTARVY ) 50-200-25 MG TABS tablet   Other Relevant Orders   Comprehensive metabolic panel with GFR   HIV-1 RNA quant-no reflex-bld   T-helper cell (CD4)- ([facility] clinic only)   Healthcare maintenance   Discussed importance of safe sexual practice and condom use. Condoms and site specific STD testing offered.  Vaccinations reviewed declined following counseling.  Due for dental care which he will schedule      Tobacco use   Currently smoking approximately 1 pack of cigarettes per day. Counseled on the dangers of tobacco not ready to quit at this time.  Reviewed strategies to maximize success, including removing cigarettes and smoking  materials from environment, stress management, substitution of other forms of reinforcement, support of family/friends, and written materials.        Heroin addiction [facility])   Mr. Antu continues to use heroin daily through inhalation.  Discussed importance of cessation to reduce risk of complications in the future.  Does not appear ready to quit at this time.  Resources provided.      Other Visit Diagnoses       Screening for STDs (sexually transmitted diseases)       Relevant Orders   RPR        I have discontinued Shafter N. Bodley's linezolid . I am also having him maintain his Acetaminophen  (TYLENOL  PO), IBUPROFEN  PO, and Biktarvy .   Meds ordered this encounter  Medications   bictegravir-emtricitabine-tenofovir AF (BIKTARVY ) 50-200-25 MG TABS tablet    Sig: Take 1 tablet by mouth daily.    Dispense:  30 tablet    Refill:  5    Please mail    Supervising Provider:   LUIZ CHANNEL [phone]    Prescription Type::   Renewal     Follow-up: Return in about 4 months (around 10/10/2024). or sooner if needed.    Cathlyn July, MSN, FNP-C Nurse Practitioner [facility] for Infectious Disease [facility] Main number: [phone]

## 2024-06-09 NOTE — Patient Instructions (Signed)
 Nice to see you.  We will check your lab work today.  Continue to take your medication daily as prescribed.  Refills have been sent to the pharmacy.  Plan for follow up in 4 months or sooner if needed with lab work on the same day.  Have a great day and stay safe!

## 2024-06-09 NOTE — Progress Notes (Signed)
 Specialty Pharmacy Initial Fill Coordination Note  Aaron Roman is a 33 y.o. male contacted today regarding initial fill of specialty medication(s) Bictegravir-Emtricitab-Tenofov (Biktarvy )   Patient requested Delivery   Delivery date: 06/16/24   Verified address: [address] [place] [address]   Medication will be filled on 06/15/24.   Patient is aware of $0 copayment.

## 2024-06-09 NOTE — Assessment & Plan Note (Signed)
 Currently smoking approximately 1 pack of cigarettes per day. Counseled on the dangers of tobacco not ready to quit at this time.  Reviewed strategies to maximize success, including removing cigarettes and smoking materials from environment, stress management, substitution of other forms of reinforcement, support of family/friends, and written materials.

## 2024-06-09 NOTE — Assessment & Plan Note (Signed)
 Aaron Roman continues to use heroin daily through inhalation.  Discussed importance of cessation to reduce risk of complications in the future.  Does not appear ready to quit at this time.  Resources provided.

## 2024-06-10 LAB — T-HELPER CELL (CD4) - (RCID CLINIC ONLY)
CD4 % Helper T Cell: 37 % (ref 33–65)
CD4 T Cell Abs: 779 /uL (ref 400–1790)

## 2024-06-11 ENCOUNTER — Ambulatory Visit: Payer: Self-pay | Admitting: Family

## 2024-06-11 LAB — COMPREHENSIVE METABOLIC PANEL WITH GFR
AG Ratio: 1.7 (calc) (ref 1.0–2.5)
ALT: 33 U/L (ref 9–46)
AST: 22 U/L (ref 10–40)
Albumin: 4.8 g/dL (ref 3.6–5.1)
Alkaline phosphatase (APISO): 151 U/L — ABNORMAL HIGH (ref 36–130)
BUN/Creatinine Ratio: 6 (calc) (ref 6–22)
BUN: 8 mg/dL (ref 7–25)
CO2: 31 mmol/L (ref 20–32)
Calcium: 9.4 mg/dL (ref 8.6–10.3)
Chloride: 101 mmol/L (ref 98–110)
Creat: 1.27 mg/dL — ABNORMAL HIGH (ref 0.60–1.26)
Globulin: 2.8 g/dL (ref 1.9–3.7)
Glucose, Bld: 111 mg/dL — ABNORMAL HIGH (ref 65–99)
Potassium: 4 mmol/L (ref 3.5–5.3)
Sodium: 140 mmol/L (ref 135–146)
Total Bilirubin: 0.3 mg/dL (ref 0.2–1.2)
Total Protein: 7.6 g/dL (ref 6.1–8.1)
eGFR: 76 mL/min/1.73m2 (ref >=60)

## 2024-06-11 LAB — RPR: RPR Ser Ql: NONREACTIVE

## 2024-06-11 LAB — HIV-1 RNA QUANT-NO REFLEX-BLD
HIV 1 RNA Quant: NOT DETECTED {copies}/mL
HIV-1 RNA Quant, Log: NOT DETECTED {Log_copies}/mL

## 2024-07-08 ENCOUNTER — Other Ambulatory Visit: Payer: Self-pay

## 2024-07-10 ENCOUNTER — Other Ambulatory Visit (HOSPITAL_COMMUNITY): Payer: Self-pay

## 2024-07-14 ENCOUNTER — Other Ambulatory Visit: Payer: Self-pay

## 2024-07-14 ENCOUNTER — Other Ambulatory Visit: Payer: Self-pay | Admitting: Pharmacy Technician

## 2024-07-14 NOTE — Progress Notes (Signed)
 Specialty Pharmacy Refill Coordination Note  Aaron Roman is a 33 y.o. male contacted today regarding refills of specialty medication(s) Bictegravir-Emtricitab-Tenofov (Biktarvy )   Patient requested Delivery   Delivery date: 07/17/24   Verified address: [address]  [place]   Medication will be filled on: 07/16/24

## 2024-07-15 ENCOUNTER — Other Ambulatory Visit: Payer: Self-pay

## 2024-08-07 ENCOUNTER — Other Ambulatory Visit: Payer: Self-pay | Admitting: Pharmacy Technician

## 2024-08-07 ENCOUNTER — Other Ambulatory Visit: Payer: Self-pay

## 2024-08-07 NOTE — Progress Notes (Signed)
 Specialty Pharmacy Refill Coordination Note  JESTER KLINGBERG is a 33 y.o. male contacted today regarding refills of specialty medication(s) Bictegravir-Emtricitab-Tenofov (Biktarvy )   Patient requested (Patient-Rptd) Delivery   Delivery date: 08/11/2024 Verified address: (Patient-Rptd) [address] [place], [place] [address]   Medication will be filled on: 08/10/2024

## 2024-08-10 ENCOUNTER — Other Ambulatory Visit: Payer: Self-pay

## 2024-09-01 ENCOUNTER — Other Ambulatory Visit (HOSPITAL_COMMUNITY): Payer: Self-pay

## 2024-09-04 ENCOUNTER — Other Ambulatory Visit: Payer: Self-pay

## 2024-09-04 NOTE — Progress Notes (Signed)
 Specialty Pharmacy Refill Coordination Note  Aaron Roman is a 34 y.o. male contacted today regarding refills of specialty medication(s) Bictegravir-Emtricitab-Tenofov (Biktarvy )   Patient requested Delivery   Delivery date: 09/08/24   Verified address: [address] [place], [place] [address]   Medication will be filled on: 09/07/24

## 2024-09-07 ENCOUNTER — Other Ambulatory Visit: Payer: Self-pay

## 2024-09-30 ENCOUNTER — Other Ambulatory Visit (HOSPITAL_COMMUNITY): Payer: Self-pay

## 2024-10-02 ENCOUNTER — Other Ambulatory Visit (HOSPITAL_COMMUNITY): Payer: Self-pay

## 2024-10-02 ENCOUNTER — Other Ambulatory Visit: Payer: Self-pay

## 2024-10-02 NOTE — Progress Notes (Signed)
 Specialty Pharmacy Refill Coordination Note  Spoke with Sasha N Mazurek  Toni N Hauswirth is a 34 y.o. male contacted today regarding refills of specialty medication(s) Bictegravir-Emtricitab-Tenofov (Biktarvy )  Doses on hand: 7  Patient requested: Delivery   Delivery date: 10/07/24   Verified address: [address] [place] [address]  Medication will be filled on 10/06/24

## 2024-10-06 ENCOUNTER — Other Ambulatory Visit: Payer: Self-pay

## 2024-10-20 ENCOUNTER — Ambulatory Visit: Payer: MEDICAID | Admitting: Family
